# Patient Record
Sex: Female | Born: 1965 | Hispanic: No | Marital: Married | Smoking: Never smoker
Health system: Southern US, Community
[De-identification: ages and names within clinical notes are randomized; demographics above are authoritative.]

## PROBLEM LIST (undated history)

## (undated) DIAGNOSIS — T7840XA Allergy, unspecified, initial encounter: Secondary | ICD-10-CM

## (undated) HISTORY — PX: ENDOVENOUS ABLATION SAPHENOUS VEIN W/ LASER: SUR449

## (undated) HISTORY — PX: WISDOM TOOTH EXTRACTION: SHX21

## (undated) HISTORY — DX: Allergy, unspecified, initial encounter: T78.40XA

## (undated) HISTORY — PX: EYE SURGERY: SHX253

---

## 1997-06-22 ENCOUNTER — Inpatient Hospital Stay (HOSPITAL_COMMUNITY): Admission: AD | Admit: 1997-06-22 | Discharge: 1997-06-22 | Payer: Self-pay | Admitting: Obstetrics and Gynecology

## 1997-06-30 ENCOUNTER — Encounter (HOSPITAL_COMMUNITY): Admission: AD | Admit: 1997-06-30 | Discharge: 1997-07-05 | Payer: Self-pay | Admitting: Obstetrics and Gynecology

## 1997-07-04 ENCOUNTER — Inpatient Hospital Stay (HOSPITAL_COMMUNITY): Admission: AD | Admit: 1997-07-04 | Discharge: 1997-07-06 | Payer: Self-pay | Admitting: *Deleted

## 1997-08-07 ENCOUNTER — Other Ambulatory Visit: Admission: RE | Admit: 1997-08-07 | Discharge: 1997-08-07 | Payer: Self-pay | Admitting: Obstetrics and Gynecology

## 1998-08-14 ENCOUNTER — Other Ambulatory Visit: Admission: RE | Admit: 1998-08-14 | Discharge: 1998-08-14 | Payer: Self-pay | Admitting: Obstetrics and Gynecology

## 1999-09-10 ENCOUNTER — Other Ambulatory Visit: Admission: RE | Admit: 1999-09-10 | Discharge: 1999-09-10 | Payer: Self-pay | Admitting: Obstetrics and Gynecology

## 2000-11-03 ENCOUNTER — Other Ambulatory Visit: Admission: RE | Admit: 2000-11-03 | Discharge: 2000-11-03 | Payer: Self-pay | Admitting: *Deleted

## 2001-08-19 ENCOUNTER — Inpatient Hospital Stay (HOSPITAL_COMMUNITY): Admission: AD | Admit: 2001-08-19 | Discharge: 2001-08-23 | Payer: Self-pay | Admitting: *Deleted

## 2001-10-12 ENCOUNTER — Other Ambulatory Visit: Admission: RE | Admit: 2001-10-12 | Discharge: 2001-10-12 | Payer: Self-pay | Admitting: *Deleted

## 2002-11-01 ENCOUNTER — Other Ambulatory Visit: Admission: RE | Admit: 2002-11-01 | Discharge: 2002-11-01 | Payer: Self-pay | Admitting: *Deleted

## 2003-05-14 ENCOUNTER — Inpatient Hospital Stay (HOSPITAL_COMMUNITY): Admission: AD | Admit: 2003-05-14 | Discharge: 2003-05-14 | Payer: Self-pay | Admitting: Obstetrics and Gynecology

## 2003-08-31 ENCOUNTER — Inpatient Hospital Stay (HOSPITAL_COMMUNITY): Admission: AD | Admit: 2003-08-31 | Discharge: 2003-09-06 | Payer: Self-pay | Admitting: *Deleted

## 2003-09-02 ENCOUNTER — Encounter (INDEPENDENT_AMBULATORY_CARE_PROVIDER_SITE_OTHER): Payer: Self-pay | Admitting: Specialist

## 2003-09-07 ENCOUNTER — Encounter: Admission: RE | Admit: 2003-09-07 | Discharge: 2003-10-07 | Payer: Self-pay | Admitting: Obstetrics and Gynecology

## 2003-10-20 ENCOUNTER — Other Ambulatory Visit: Admission: RE | Admit: 2003-10-20 | Discharge: 2003-10-20 | Payer: Self-pay | Admitting: Obstetrics & Gynecology

## 2004-02-12 ENCOUNTER — Ambulatory Visit: Payer: Self-pay | Admitting: Family Medicine

## 2004-11-05 ENCOUNTER — Other Ambulatory Visit: Admission: RE | Admit: 2004-11-05 | Discharge: 2004-11-05 | Payer: Self-pay | Admitting: Obstetrics & Gynecology

## 2005-01-16 ENCOUNTER — Encounter: Payer: Self-pay | Admitting: Family Medicine

## 2005-02-03 ENCOUNTER — Encounter: Admission: RE | Admit: 2005-02-03 | Discharge: 2005-02-03 | Payer: Self-pay | Admitting: Obstetrics & Gynecology

## 2005-02-13 ENCOUNTER — Ambulatory Visit: Payer: Self-pay | Admitting: Family Medicine

## 2005-09-29 ENCOUNTER — Ambulatory Visit: Payer: Self-pay | Admitting: Family Medicine

## 2006-03-18 ENCOUNTER — Ambulatory Visit: Payer: Self-pay | Admitting: Family Medicine

## 2007-01-27 ENCOUNTER — Encounter: Admission: RE | Admit: 2007-01-27 | Discharge: 2007-01-27 | Payer: Self-pay | Admitting: Obstetrics & Gynecology

## 2008-01-10 ENCOUNTER — Ambulatory Visit: Payer: Self-pay | Admitting: Family Medicine

## 2008-04-10 IMAGING — MG MM DIGITAL SCREENING
4 series · 4 of 4 positions shown · non-contrast
Comparison: none

DG SCREEN MAMMOGRAM BILATERAL
Bilateral CC and MLO view(s) were taken.

DIGITAL SCREENING MAMMOGRAM WITH CAD:
The breast tissue is heterogeneously dense.  No masses or malignant type calcifications are 
identified.  Compared with prior studies.

[R CC]
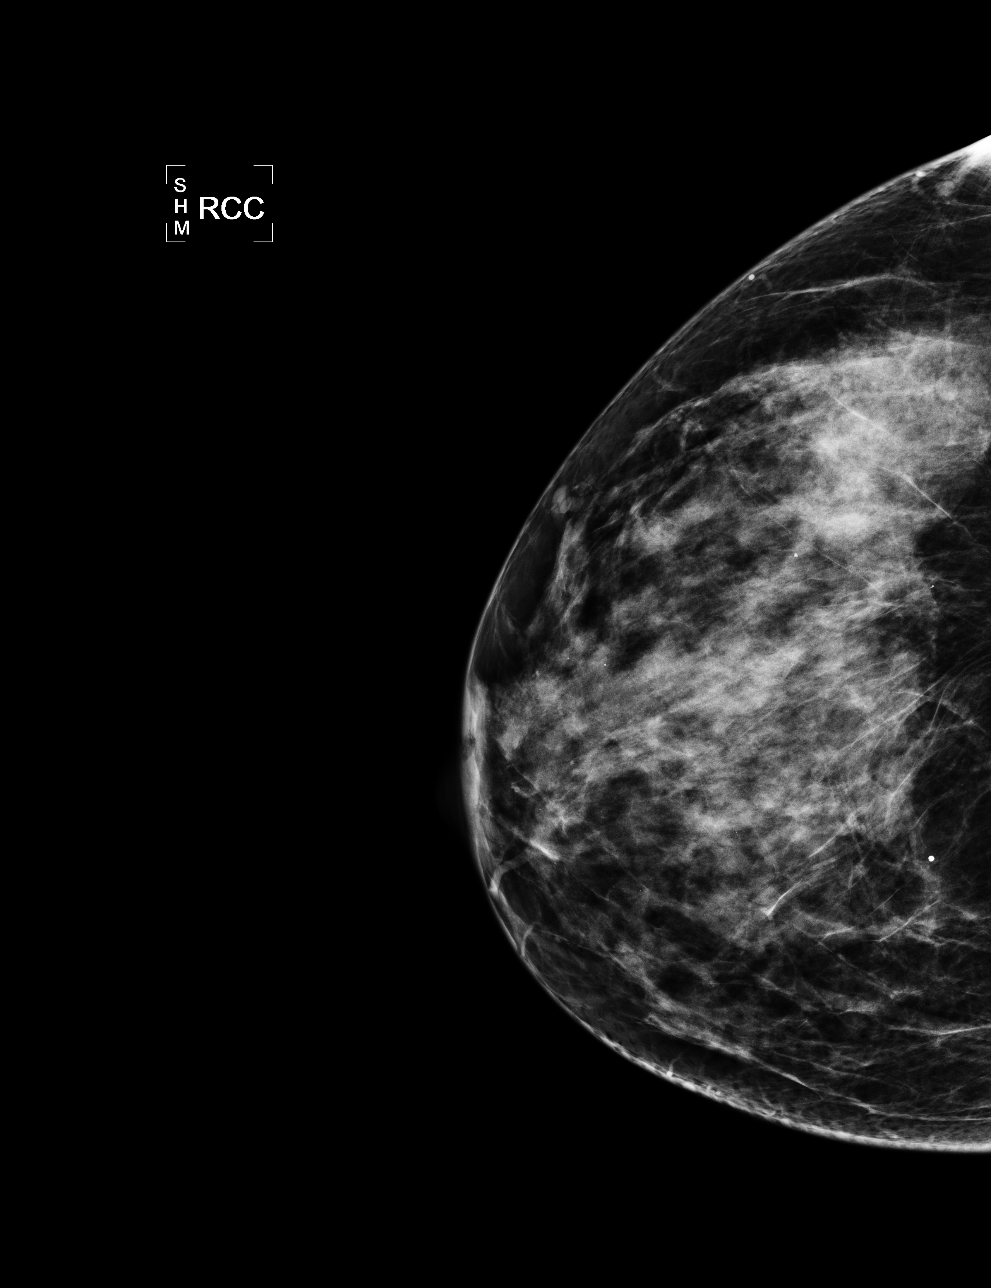

[R MLO]
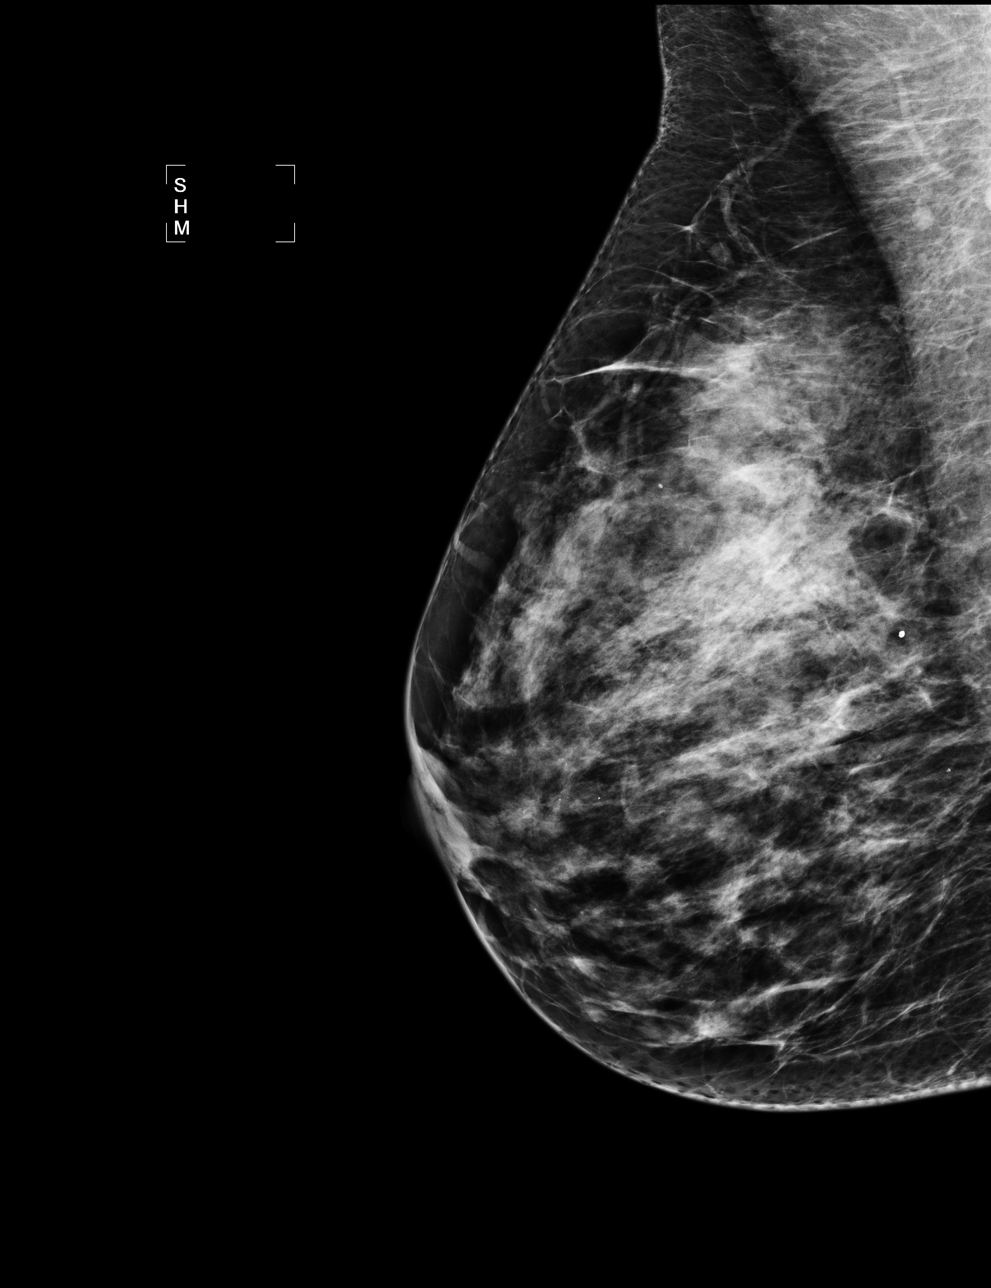

[L CC]
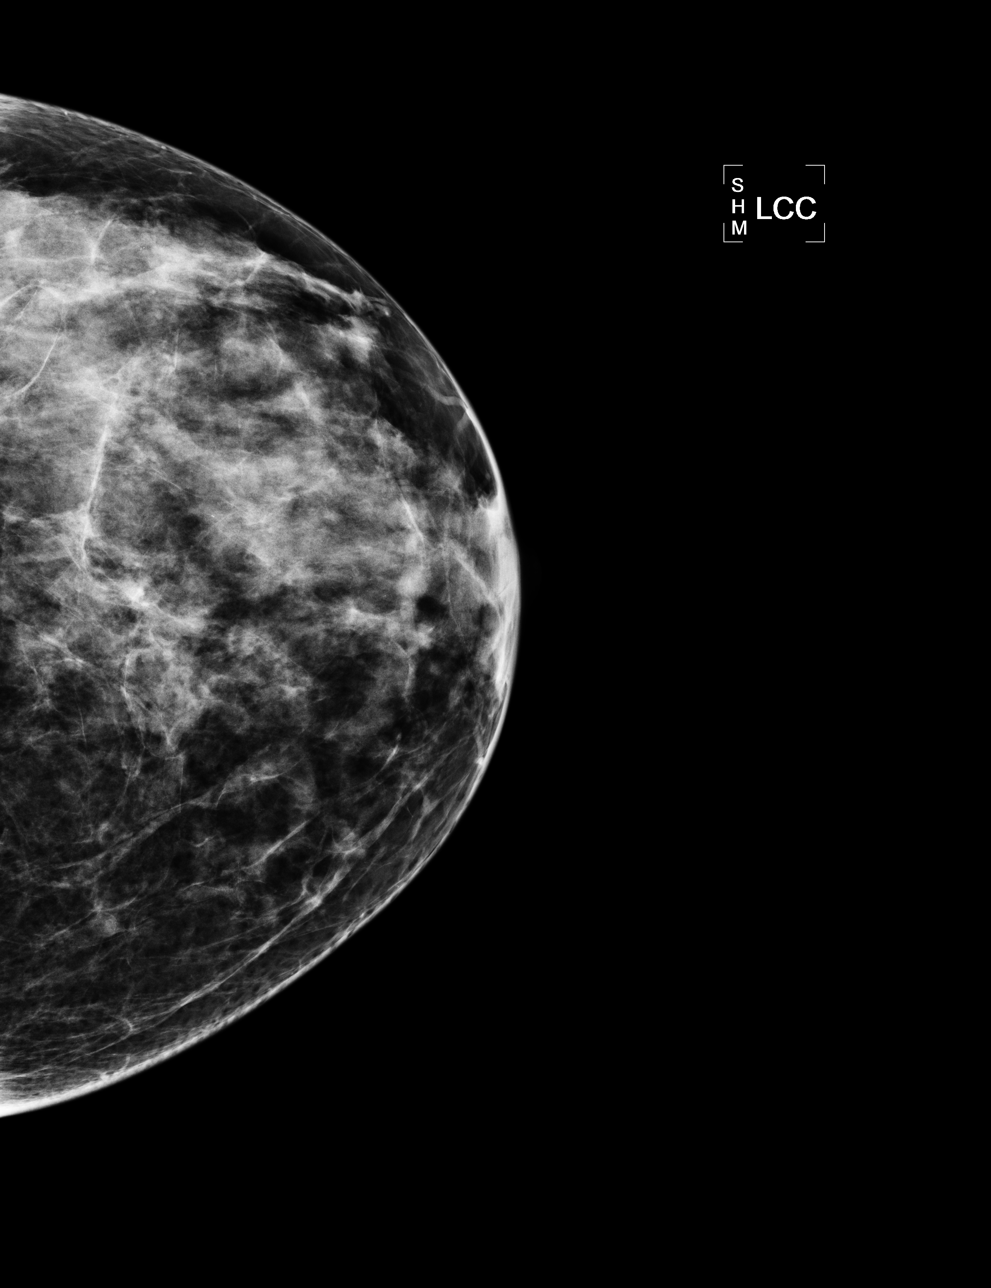

[L MLO]
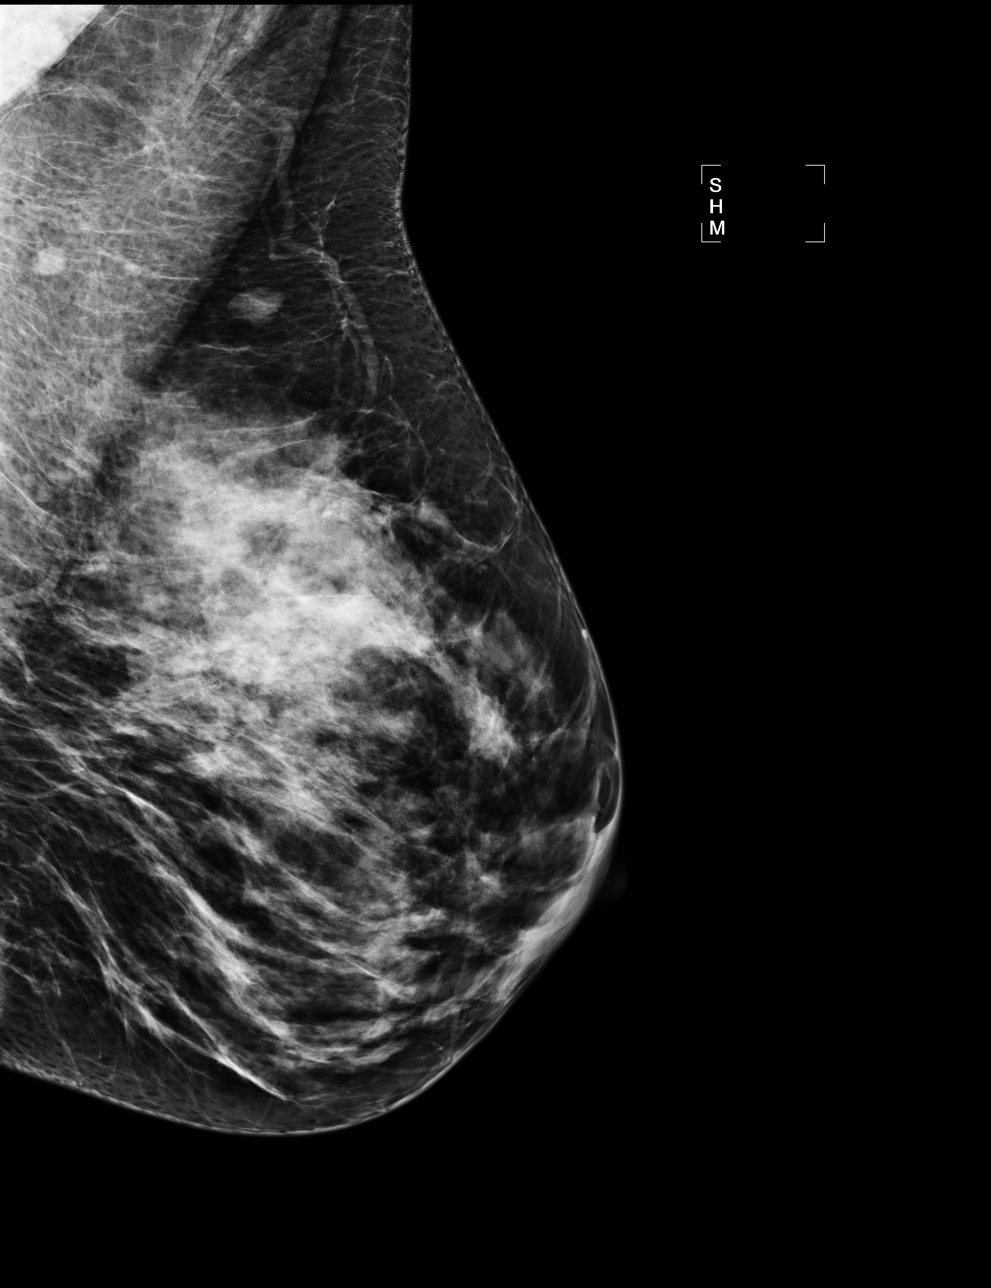

[4 of 4 positions shown; findings below may reference images not displayed]

IMPRESSION: No specific mammographic evidence of malignancy.  Next screening mammogram is recommended in one 
year.

ASSESSMENT: Negative - BI-RADS 1

Screening mammogram in 1 year.
THIS WAS ANALAYZED BY COMPUTER AIDED DETECTION. , THIS PROCEDURE WAS A DIGITAL MAMMOGRAM.

## 2008-09-13 ENCOUNTER — Encounter: Payer: Self-pay | Admitting: Family Medicine

## 2008-12-04 ENCOUNTER — Encounter: Payer: Self-pay | Admitting: Family Medicine

## 2008-12-05 ENCOUNTER — Ambulatory Visit: Payer: Self-pay | Admitting: Family Medicine

## 2008-12-05 DIAGNOSIS — M79609 Pain in unspecified limb: Secondary | ICD-10-CM

## 2008-12-18 ENCOUNTER — Ambulatory Visit: Payer: Self-pay

## 2009-11-27 ENCOUNTER — Encounter: Payer: Self-pay | Admitting: Family Medicine

## 2010-04-30 NOTE — Assessment & Plan Note (Signed)
Summary: L ARM PAIN WHILE HIKING/CLE   Vital Signs:  Patient profile:   45 year old female Height:      64 inches Weight:      117 pounds BMI:     20.16 Temp:     98 degrees F oral Pulse rate:   76 / minute Pulse rhythm:   regular BP sitting:   100 / 68  (left arm)  Vitals Entered By: Liane Comber CMA Duncan Dull) (December 05, 2008 10:25 AM)  History of Present Illness: was out hiking  started getting tingling in fingers in L hand and then pain into L arm  this made her panicy-- and then HR inc and chest got tight (no pain ) lasted 5-10 min at most  sat down and rested-- and then she was fine   no residual sorenss at all   never happened to her in past  this was not particularly hard trail-- has done fine on other harder trails  was not overheated  had eaten lasagna for lunch  no heartburn   is in fairly good shape- plays sports with kid   no alcohol , no caff and never smoked   never had high cholesterol - last checked 2-3 y ago  at gyn office , good     Allergies (verified): 1)  ! Sulfa 2)  ! Amoxicillin  Past History:  Past Medical History:     gyn- Dr Jennette Kettle  Family History: father - chol  GM- CAD/ bypass- at old age  uncles/ aunts with chol  no high bp in family  Social History: non smoker - never smoked  no alcohol  no caffine has 78 y old daughter    Review of Systems General:  Denies fatigue, fever, loss of appetite, and malaise. Eyes:  Denies blurring and eye pain. CV:  Denies difficulty breathing at night, difficulty breathing while lying down, palpitations, shortness of breath with exertion, and swelling of feet; no PND or orthopnea . GI:  Denies indigestion, nausea, and vomiting. MS:  Denies joint pain, joint redness, and joint swelling. Derm:  Denies itching, lesion(s), poor wound healing, and rash. Neuro:  Complains of tingling; denies numbness and weakness. Psych:  mood is ok. Endo:  Denies cold intolerance, excessive thirst,  excessive urination, and heat intolerance. Heme:  Denies abnormal bruising and bleeding.  Physical Exam  General:  Well-developed,well-nourished,in no acute distress; alert,appropriate and cooperative throughout examination Head:  normocephalic, atraumatic, and no abnormalities observed.   Eyes:  vision grossly intact, pupils equal, pupils round, and pupils reactive to light.   Mouth:  pharynx pink and moist.   Neck:  supple with full rom and no masses or thyromegally, no JVD or carotid bruit  Chest Wall:  No deformities, masses, or tenderness noted. Lungs:  Normal respiratory effort, chest expands symmetrically. Lungs are clear to auscultation, no crackles or wheezes. Heart:  Normal rate and regular rhythm. S1 and S2 normal without gallop, murmur, click, rub or other extra sounds. Abdomen:  Bowel sounds positive,abdomen soft and non-tender without masses, organomegaly or hernias noted. Msk:  no kyphosis  no acute joint changes  Extremities:  No clubbing, cyanosis, edema, or deformity noted with normal full range of motion of all joints.   Neurologic:  sensation intact to light touch, gait normal, and DTRs symmetrical and normal.   Skin:  Intact without suspicious lesions or rashes Cervical Nodes:  No lymphadenopathy noted Inguinal Nodes:  No significant adenopathy Psych:  normal affect, talkative  and pleasant    Impression & Recommendations:  Problem # 1:  ARM PAIN, LEFT (ICD-729.5) Assessment New exertional- solitary episode with no assoc symptoms  some remote cardiac hx in family in old age no risk factors  will obt last chol from her GYN send for exercise only treadmill stress test and update Orders: EKG w/ Interpretation (93000) Cardiology Referral (Cardiology) EKG w/ Interpretation (93000)  Complete Medication List: 1)  Ortho Tri-cyclen Lo 0.18/0.215/0.25 Mg-25 Mcg Tabs (Norgestim-eth estrad triphasic) .... Use as directed 2)  Multivitamins Tabs (Multiple vitamin) ....  By mouth daily  Patient Instructions: 1)  send to Dr Jennette Kettle (gyn ) for last cholesterol labs  2)  we will refer you for treadmill stress test at check out  3)  eat a healthy diet  4)  update me / seek care if symptoms return   Prior Medications (reviewed today): ORTHO TRI-CYCLEN LO 0.18/0.215/0.25 MG-25 MCG TABS (NORGESTIM-ETH ESTRAD TRIPHASIC) use as directed MULTIVITAMINS   TABS (MULTIPLE VITAMIN) by mouth daily Current Allergies (reviewed today): ! SULFA ! AMOXICILLIN Current Medications (including changes made in today's visit):  ORTHO TRI-CYCLEN LO 0.18/0.215/0.25 MG-25 MCG TABS (NORGESTIM-ETH ESTRAD TRIPHASIC) use as directed MULTIVITAMINS   TABS (MULTIPLE VITAMIN) by mouth daily     EKG  Procedure date:  12/05/2008  Findings:      NSR with rate of 61 no acute changes computer reads as poor R wave progression

## 2010-04-30 NOTE — Assessment & Plan Note (Signed)
Summary: treadmill with PA/jml   Exercise Tolerance Test Cardiovascular Risk History:      Negative major cardiovascular risk factors include female age < 47 years old, no history of diabetes or hyperlipidemia, no history of hypertension, and non-tobacco-Sady Mon[place] status.        Further assessment for target organ damage reveals no history of ASHD, cardiac end-organ damage (CHF/LVH), stroke/TIA, peripheral vascular disease, renal insufficiency, or hypertensive retinopathy.    Baseline EKG:    Rhythm:     normal sinus    Rate:       95    PR:       .13    QRS:       .07    QT:       .34    QTc:       .42  Exercise Tolerance Test Results:    Ordering MD:        Roxy Manns    Interpreting MD:     Jacolyn Reedy [facility]    Stress Modality:     exercise-treadmill    Cardiac Imaging Performed:   none    Protocol:       Standard Bruce-maximal    Maximum BP:        140 / 68    MPHR (bpm):        176    85% MPHR (bpm):     150    MHR obtained (bpm):        181    Reached 85% MPHR       (min:sec):       6:00    Total Exercise Time       (min:sec):       10:16    Workload in METS:     12.0    Borg Scale:       14    ST Segment analysis:       At Rest:       normal ST segments-no evidence of significant ST depression       With Exercise:     no evidence of significant ST depression    Arrhythmia:             no    Angina during ETT:     absent (0)  Cardiovascular Risk Assessment/Plan:      The patient's hypertensive risk group is category A: No risk factors and no target organ damage.  Today's blood pressure is 88/73.  Her blood pressure goal is < 140/90.  Exercise Tolerance Test Assessment:    Quality of ETT:   diagnostic    ETT Interpretation:   normal-no evidence of ischemia by ST analysis    Comments:     Early elevation of heart rate consistant with deconditioning. No ST-T changes.     Recommendations:   f/u Dr. Milinda Antis  Appended Document: treadmill with PA/jml please adv pt that stress  test is normal- that is reassuring  Left message on voicemail  to return call. Lugene Fuquay CMA (AAMA)  December 21, 2008 9:40 AM  Advised patient.  ......................................................Marland KitchenNatasha Chavers CMA (AAMA) December 21, 2008 12:01 PM  Clinical Lists Changes  Observations: Added new observation of PAST SURG HX: 9/10 exercise stress test neg (12/20/2008 20:18)        Past History:  Past Surgical History: 9/10 exercise stress test neg

## 2010-04-30 NOTE — Letter (Signed)
Summary: [facility]  [facility]   Imported By: Lanelle Bal 09/28/2008 12:48:02  _____________________________________________________________________  External Attachment:    Type:   Image     Comment:   External Document

## 2010-04-30 NOTE — Assessment & Plan Note (Signed)
Summary: FLU SHOT/CLE  Nurse Visit        Orders Added: 1)  Admin 1st Vaccine [90471] 2)  Flu Vaccine 61yrs + [44034]   Flu Vaccine Consent Questions     Do you have a history of severe allergic reactions to this vaccine? no    Any prior history of allergic reactions to egg and/or gelatin? no    Do you have a sensitivity to the preservative Thimersol? no    Do you have a past history of Guillan-Barre Syndrome? no    Do you currently have an acute febrile illness? no    Have you ever had a severe reaction to latex? no    Vaccine information given and explained to patient? yes    Are you currently pregnant? no    Lot Number:[id number]   Site Given  Left Deltoid IM].opcflu

## 2010-04-30 NOTE — Letter (Signed)
Summary: [facility]  [facility]   Imported By: Sherian Rein 12/19/2009 11:06:21  _____________________________________________________________________  External Attachment:    Type:   Image     Comment:   External Document

## 2010-08-16 NOTE — Op Note (Signed)
NAME:  Teresa Wilkerson, Teresa Wilkerson                          ACCOUNT NO.:  [id number]   MEDICAL RECORD NO.:  [id number]                   PATIENT TYPE:  INP   LOCATION:  9172                                 FACILITY:  [facility]   PHYSICIAN:  Duke Salvia. Marcelle Overlie, M.D.            DATE OF BIRTH:  26-Feb-1966   DATE OF PROCEDURE:  09/02/2003  DATE OF DISCHARGE:                                 OPERATIVE REPORT   PREOPERATIVE DIAGNOSES:  1. A 34-4/7 week intrauterine pregnancy.  2. Previous cesarean section, desires repeat.  3. PPRON.  4. Mature L:S ratio (2.2:1 no PT).   POSTOPERATIVE DIAGNOSES:  1. A 34-4/7 week intrauterine pregnancy.  2. Previous cesarean section, desires repeat.  3. PPRON.  4. Mature L:S ratio (2.2:1 no PT).   PROCEDURE:  Repeat low transverse cesarean section.   SURGEON:  Duke Salvia. Marcelle Overlie, M.D.   ANESTHESIA:  Spinal.   COMPLICATIONS:  None.   DRAINS:  Foley catheter.   ESTIMATED BLOOD LOSS:  800 cc.   PROCEDURE AND FINDINGS:  The patient was taken to the operating room.  After  an adequate level of spinal anesthetic was obtained, with the patient's legs  supine, the abdomen prepped and draped in the usual manner for sterile  abdominal procedures.  A Foley catheter was positioned, draining clear  urine.  A transverse incision was made, excising the old scar.  This was  carried down to the fascia, which was incised and extended transversely.  Rectus muscles were divided in the midline.  The peritoneum was entered  superiorly without incident, and extended in a vertical fashion.  There were  no unusual scar tissue in that area. The bladder flap and peritoneum was  then incised.  The bladder was sharply and bluntly dissected below, and the  bladder blade was positioned.  A small incision was made transversely in the  lower segment, which was quite thin. This was extended with blunt dissection  and clear fluid noted.  A vertex presenting, Apgar 8 and 9, pH pending.  The  infant suctioned, cord clamped and infant passed to pediatric team for  further care.  The placenta delivered manually intact; sent to pathology.  The uterine cavity was wiped clean with the laparotomy pack.  Closure  obtained with first layer of 0 chromic interlocked fashion, followed by an  embrocating layer of 0 chromic.  This was hemostatic.   The tubes and ovaries were normal.  Prior to closure sponge, needle and  instrument counts were reported as correct x2.  The peritoneum was closed  with a running 2-0 Dexon suture.  The fascia was closed from _________ to  midline on either side with a  O PDS suture.  Subcutaneous was hemostatic.  Clips and Steri-Strips used on  the skin.   She tolerated this well and went to the recovery room in good condition.  Clear urine noted at the end of the  case.  Pefloxacin will be continued for  several doses postoperatively.                                               Richard M. Marcelle Overlie, M.D.    RMH/MEDQ  D:  09/02/2003  T:  09/02/2003  Job:  [id number]

## 2010-08-16 NOTE — Discharge Summary (Signed)
[facility]  Patient:    ARNOLA, CRITTENDON Visit Number: [id number] MRN: [id number]          Service Type: OBS Location: 910A 9114 01 Attending Physician:  Trevor Iha Dictated by:   Julio Sicks, N.P. Admit Date:  08/19/2001 Discharge Date: 08/23/2001                             Discharge Summary  ADMITTING DIAGNOSES:          1. Intrauterine pregnancy at term.                               2. Induction of labor.  DISCHARGE DIAGNOSES:          1. Low transverse cesarean delivery.                               2. Breech presentation.                               3. Viable female infant.  PROCEDURE:                    Primary low transverse cesarean delivery.  REASON FOR ADMISSION:         Please see dictated H&P.  HOSPITAL COURSE:              The patient was admitted for an induction of labor at 40 weeks estimated gestational age.  The patient underwent Cytotec cervical ripening and progressed to 4 cm dilated.  At that time, the baby was found to be in a breech presentation.  Because of labor and breech, a decision was made to proceed with a primary low transverse cesarean delivery.  The patient was taken to the operating room where a spinal anesthesia was administered without difficulty.  A low transverse incision was made with the delivery of a viable female infant.  Apgars were 9 at one minute and 10 at five minutes.  Arterial pH was 7.30.  The patient tolerated the procedure well and was taken to the recovery room.  On postoperative day one, the patient was without complaint.  Abdominal dressing was clean, dry, and intact.  The patient was tolerating a clear liquid diet without complaint.  Labs revealed hemoglobin of 9.6, hematocrit 28.5, WBC 7.8.  On postoperative day two, the incision was clean, dry, and intact; abdomen soft.  The patient was ambulating well without assistance.  On postoperative day three, incision was clean,  dry, and intact.  The staples were removed, and the patient was discharged home.  CONDITION ON DISCHARGE:       Good.  DIET:                         Regular as tolerated.  ACTIVITY:                     No heavy lifting.  No driving x 2 weeks.  No vaginal entry.  FOLLOW-UP:                    The patient is to follow up in the office in 1-2 weeks for an incision check.  She is to  call for a temperature greater than 100 degrees, persistent nausea or vomiting, heavy vaginal bleeding and/or redness or drainage from the incision site.  DISCHARGE MEDICATIONS:        1. Prenatal vitamins 1 p.o. q.d.                               2. Percocet 5/325 #21 q.4-6h. p.r.n. pain. Dictated by:   Julio Sicks, N.P. Attending Physician:  Trevor Iha DD:  09/03/01 TD:  09/06/01 Job: [id number]

## 2010-08-16 NOTE — Discharge Summary (Signed)
NAME:  Wilkerson, Teresa K                          ACCOUNT NO.:  [id number]   MEDICAL RECORD NO.:  [id number]                   PATIENT TYPE:  INP   LOCATION:  9318                                 FACILITY:  [facility]   PHYSICIAN:  Michelle L. Vincente Poli, M.D.            DATE OF BIRTH:  07-01-1965   DATE OF ADMISSION:  08/31/2003  DATE OF DISCHARGE:  09/06/2003                                 DISCHARGE SUMMARY   ADMITTING DIAGNOSES:  1. Intrauterine pregnancy at 41 and two-sevenths weeks estimated gestational     age.  2. Preterm premature rupture of membranes.  3. Previous cesarean section, desires repeat.   DISCHARGE DIAGNOSES:  1. Status post low transverse cesarean section.  2. Viable female infant.   PROCEDURE:  Repeat low transverse cesarean section.   REASON FOR ADMISSION:  Please see written H&P.   HOSPITAL COURSE:  The patient was a 45 year old [place] married female gravida  3 para 2 that was admitted to [facility] at 34 and two-  sevenths weeks with preterm premature rupture of membranes.  The patient had  had a previous cesarean section for a breech delivery with her second child.  On the morning of admission the patient was noted to have spontaneous  rupture of membranes with clear fluid followed by uterine contractions.  Cervix was 1 cm, thick, and a -2 station.  Ultrasound was performed which  revealed a fetus in vertex presentation.  Uterine contractions were  irregular and mild.  CBC was drawn with hemoglobin of 11.5; platelet count  162,000; wbc count of 7.4.  The patient was started on IV antibiotics.  Fetal heart tones remained reactive.  Uterine contractions continued to be  irregular and mild.  Ultrasound was performed which revealed a fetus with  estimated fetal weight of 2200-2300 g which was the 50th percentile.  AFI  was 6.5.  The following morning the patient was noted to have a rare  contractions.  Fetal heart tones remained reactive in the 140s.   She was  afebrile.  Decision was made for amniocentesis for determination of lung  maturity which resulted in an L/S ratio of 2.2:1 without PG.  Discussion was  made with the patient regarding risks of chorioamnionitis and decision was  made to proceed with a low transverse cesarean section.  On the following  morning the patient was taken to the operating room where spinal anesthesia  was administered without difficulty.  A low transverse incision was made  with the delivery of a viable female infant weighing 5 pounds 0.3 ounces  with Apgars of 8 at one minute and 9 at five minutes . Umbilical cord pH was  7.37.  The patient tolerated the procedure well and was taken to the  recovery room in stable condition.  On postoperative day #1 the patient was  without complaint.  Vital signs were stable; she was afebrile.  Abdomen was  soft with good return of bowel function.  Abdominal dressing was noted to be  clean, dry, and intact.  Labs revealed hemoglobin of 11.2.  On postoperative  day #2 the patient was without complaint.  The baby was in the NICU.  Vital  signs were stable; she was afebrile.  Abdomen was soft.  She was ambulating  well and tolerating a regular diet without complaints of nausea and  vomiting.  Labs revealed hemoglobin of 11.2.  On postoperative day #3 the  patient was without complaint.  Vital signs were stable.  Fundus was firm  and nontender.  Incision was clean, dry, and intact.  On postoperative day  #4 the patient was without complaint.  Vital signs were stable.  Abdomen was  soft, fundus was firm.  Incision was noted to have some ecchymosis noted  superior and inferior to the incisional site.  Staples remained in.  Discharge instructions were reviewed and the patient was discharged home.   CONDITION ON DISCHARGE:  Good.   DIET:  Regular as tolerated.   ACTIVITY:  No heavy lifting, no driving x2 weeks, no vaginal entry.   FOLLOW-UP:  The patient is to follow up in  the office in 2-3 days for staple  removal.  She is to call for temperature greater than 100 degrees,  persistent nausea and vomiting, heavy vaginal bleeding, and/or redness or  drainage from the incisional site.   DISCHARGE MEDICATIONS:  1. Tylox #30 one p.o. q.4-6h. p.r.n.  2. Motrin 600 mg q.6h. p.r.n.  3. Prenatal vitamins one p.o. daily.  4. Colace one p.o. daily p.r.n.     Julio Sicks, N.P.                        Stann Mainland. Vincente Poli, M.D.    CC/MEDQ  D:  09/29/2003  T:  09/29/2003  Job:  [id number]

## 2010-08-16 NOTE — H&P (Signed)
[facility]  Patient:    Teresa Wilkerson, Teresa Wilkerson Visit Number: [id number] MRN: [id number]          Service Type: OBS Location: 910A 9114 01 Attending Physician:  Trevor Iha Dictated by:   Trevor Iha, M.D. Admit Date:  08/19/2001   CC:         Marcelle Overlie, M.D.   History and Physical  HISTORY OF PRESENT ILLNESS:  The patient is a 45 year old g2 p1 at 39-5/7 weeks who presents for a 2 stage induction due to social reasons.  Pregnancy has been complicated by advanced maternal age with a normal amniocentesis, group B streptococcus was negative.  Her cervical examination on Aug 19, 2001, was 1, thick, and high.  PAST MEDICAL HISTORY:  Negative.  PAST SURGICAL HISTORY:  Wisdom teeth extraction and she had spontaneous vaginal delivery in 1999.  PHYSICAL EXAMINATION:  VITAL SIGNS:  Blood pressure 108/64.  HEART:  Regular rate and rhythm.  LUNGS:  Clear to auscultation bilaterally.  ABDOMEN:  Gravid and nontender.  Fundal height is 37.  Cervix is 1, thick, and high per Dr. Vincente Poli.  IMPRESSION AND PLAN:  Intrauterine pregnancy at 39-5/7 weeks.  Desires induction of labor.  Plan 2 stage induction.  Plan Cytotec tonight and Pitocin in the morning. Dictated by:   Trevor Iha, M.D. Attending Physician:  Trevor Iha DD:  08/19/01 TD:  08/19/01 Job: [id number]

## 2010-08-16 NOTE — Op Note (Signed)
[facility]  Patient:    Teresa Wilkerson, Teresa Wilkerson Visit Number: [id number] MRN: [id number]          Service Type: OBS Location: [id number] Attending Physician:  Trevor Iha Dictated by:   Trevor Iha, M.D. Proc. Date: 08/20/01 Admit Date:  08/19/2001 Discharge Date: 08/23/2001                             Operative Report  PREOPERATIVE DIAGNOSIS:       Intrauterine pregnancy at 40 weeks in labor and                               breech presentation.  POSTOPERATIVE DIAGNOSIS:      Intrauterine pregnancy at 40 weeks in labor and                               breech presentation.  OPERATION:                    Primary low segment transverse cesarean section.  SURGEON:                      Trevor Iha, M.D.  ANESTHESIA:                   Spinal.  ESTIMATED BLOOD LOSS:         800 cc.  INDICATIONS:                  The patient is a 45 year old G2, P1, at 40 weeks who was admitted for induction of labor.  She underwent Cytotec cervical ripening and she progressed to 4 cm dilated.  At that time, she was found to be breech presentation.  Because of labor and breech, proceeded with a primary low segment transverse cesarean section.  Risks and benefits were discussed and informed consent was obtained.  FINDINGS:                     At the time of surgery, a viable female infant, Apgars were 9 and 10.  PH arterial 7.30.  DESCRIPTION OF PROCEDURE:     After adequate analgesia, the patient was placed in the supine position with a left lateral tilt.  She was sterilely prepped and draped.  A Foley catheter was sterilely placed and a Pfannenstiel skin incision was made two fingerbreadths above the pubic symphysis.  It was taken down sharply and the fascia was incised transversely, and extended superiorly and inferiorly out to the belly of the superior rectus muscle which was separated sharply in the midline.  The peritoneum was entered  sharply.  A bladder blade was placed.  The uterine serosa was elevated and then excised transversely.  A bladder flap was created and placed behind the bladder blade.  A low segment myotomy incision was made down to the infants buttocks, and extended laterally with the operators fingertips, and the buttocks were then delivered atraumatically.  Arms were easily reduced and the head delivered and good cry was noted.  The cord was clamped and cut, and the infant was handed to the pediatricians.  Cord blood was obtained  The placenta was extracted manually.  The uterus was exteriorized and wiped clean with a dry laparotomy and myotomy incision  closed in two layers, the first being a running locking and a second imbricating layer of 0 Monocryl.  The uterus was placed back into the peritoneal cavity and after and after a copious amount of irrigation, adequate hemostasis was ensured.  The peritoneum was closed with 0 Monocryl and the rectus muscle plicated in the midline.  Irrigation was applied and after adequate hemostasis, the fascia was closed with #1 Vicryl in a running fashion.  Irrigation was applied after adequate hemostasis.  The skin with staples and Steri-Strips applied.  The patient tolerated the procedure well and was stable on transfer to the recovery room.  Special instrument count was normal x3.  The patient received 900 mg of clindamycin after delivery of the placenta.  Estimated blood loss was 800 cc. Dictated by:   Trevor Iha, M.D. Attending Physician:  Trevor Iha DD:  08/20/01 TD:  08/23/01 Job: [id number]

## 2011-02-28 ENCOUNTER — Ambulatory Visit (INDEPENDENT_AMBULATORY_CARE_PROVIDER_SITE_OTHER): Payer: 59 | Admitting: Family Medicine

## 2011-02-28 ENCOUNTER — Encounter: Payer: Self-pay | Admitting: Family Medicine

## 2011-02-28 DIAGNOSIS — J029 Acute pharyngitis, unspecified: Secondary | ICD-10-CM

## 2011-02-28 DIAGNOSIS — R21 Rash and other nonspecific skin eruption: Secondary | ICD-10-CM

## 2011-02-28 LAB — POCT RAPID STREP A (OFFICE): Rapid Strep A Screen: NEGATIVE

## 2011-02-28 NOTE — Patient Instructions (Signed)
I'm not sure where this rash is coming from. It could be just a viral rash after viral pharyngitis. Strep test was negative. If just viral, should improve with time. Let us know if fever >101, spreading rash or other concerns.

## 2011-02-28 NOTE — Assessment & Plan Note (Signed)
Mild erythematous rash associated with recent pharyngitis.  Checked RST - negative. Anticipate possible viral exanthem. Supportive care. Not consistent with candida.

## 2011-02-28 NOTE — Progress Notes (Addendum)
  Subjective:    Patient ID: Teresa Wilkerson, female    DOB: 02-Jul-1965, 45 y.o.   MRN: [id number]  HPI CC: rash  1d h/o rash under left arm.  Today started with rash under right arm and legs.  Bright red.  Not itchy, not tender.  Spares face, palms and soles, no oral lesions.  No fevers/chills, abd pain, n/v.  ST on Monday, hasn't improved.  Feeling more fatigued.  No fevers/chills.  Stays congested, coughing from allergies.  No new foods, medicines, lotions, detergents, shampoos, soaps.  Daughter sick this weekend with fever, ST, congestion but no rash.  No smokers at home.  H/o allergic rhinitis, takes allergy shots for this  Review of Systems Per HPI    Objective:   Physical Exam  Nursing note and vitals reviewed. Constitutional: She appears well-developed and well-nourished.  HENT:  Head: Normocephalic and atraumatic.  Right Ear: External ear normal.  Left Ear: External ear normal.  Mouth/Throat: Posterior oropharyngeal erythema present. No oropharyngeal exudate, posterior oropharyngeal edema or tonsillar abscesses.  Eyes: Conjunctivae and EOM are normal. Pupils are equal, round, and reactive to light.  Neck: Normal range of motion. Neck supple.  Cardiovascular: Normal rate, regular rhythm, normal heart sounds and intact distal pulses.   No murmur heard. Pulmonary/Chest: Effort normal and breath sounds normal. No respiratory distress. She has no wheezes. She has no rales.  Abdominal: Soft. Bowel sounds are normal. She exhibits no distension. There is no tenderness. There is no rebound and no guarding.  Musculoskeletal: She exhibits no edema.  Skin: Skin is warm and dry. Rash noted.          erythematous rash posterior axillary region as well as groin region, spares face, palms, soles, trunk and back - not papular or vesicular No blistering, peeling, scale  Psychiatric: She has a normal mood and affect.      Assessment & Plan:

## 2011-05-21 ENCOUNTER — Ambulatory Visit: Payer: 59 | Admitting: Family Medicine

## 2013-03-17 ENCOUNTER — Ambulatory Visit: Payer: 59 | Admitting: Family Medicine

## 2014-07-11 ENCOUNTER — Other Ambulatory Visit: Payer: Self-pay | Admitting: Obstetrics and Gynecology

## 2014-07-12 LAB — CYTOLOGY - PAP

## 2015-02-15 ENCOUNTER — Ambulatory Visit (INDEPENDENT_AMBULATORY_CARE_PROVIDER_SITE_OTHER): Payer: 59

## 2015-02-15 DIAGNOSIS — Z23 Encounter for immunization: Secondary | ICD-10-CM | POA: Diagnosis not present

## 2015-03-19 LAB — HM PAP SMEAR: HM Pap smear: NORMAL

## 2015-03-21 LAB — HM MAMMOGRAPHY: HM MAMMO: NORMAL

## 2015-05-07 ENCOUNTER — Encounter: Payer: Self-pay | Admitting: Internal Medicine

## 2015-05-07 ENCOUNTER — Ambulatory Visit (INDEPENDENT_AMBULATORY_CARE_PROVIDER_SITE_OTHER): Payer: 59 | Admitting: Internal Medicine

## 2015-05-07 VITALS — BP 110/70 | HR 76 | Temp 98.2°F | Ht 64.0 in | Wt 132.0 lb

## 2015-05-07 DIAGNOSIS — M722 Plantar fascial fibromatosis: Secondary | ICD-10-CM

## 2015-05-07 DIAGNOSIS — J302 Other seasonal allergic rhinitis: Secondary | ICD-10-CM | POA: Insufficient documentation

## 2015-05-07 MED ORDER — MELOXICAM 15 MG PO TABS: 15.0000 mg | ORAL_TABLET | Freq: Every day | ORAL | Status: DC

## 2015-05-07 NOTE — Progress Notes (Signed)
HPI  Pt presents to the clinic today to establish care and for management of the conditions listed below. She has not had a PCP in many years but has been following with GYN.  She does c/o righ foot pain. This started about a month ago. The pain is in her heel and radiates in to her arch. She describes the pain as dull and achy but it can be sharp and stabbing with walking. It seems worse first thing in the morning or after sitting for long periods of time. She denies any injury to the area but did start using a treadmill a few weeks prior to the onset of the foot pain. She has not tried anything OTC.  Seasonal allergies: These occur all year long. She takes Flonase as needed. She does take allergy shots every 1-2 weeks at her allergist in [place].  Flu: 01/2015 Tetanus: > 10 years ago Pap Smear: 03/2015 Mammogram: 03/2015 Colon Screening: Schedule 05/14/15 with Dr. Kinnie Scales Vision Screening: yearly Dentist: biannually  No past medical history on file.  Current Outpatient Prescriptions  Medication Sig Dispense Refill  . fluticasone (FLONASE) 50 MCG/ACT nasal spray Place 2 sprays into both nostrils daily.     No current facility-administered medications for this visit.    Allergies  Allergen Reactions  . Amoxicillin   . Sulfonamide Derivatives     Family History  Problem Relation Age of Onset  . Hyperlipidemia Father   . Coronary artery disease      GM with CABG  . Hyperlipidemia      Uncles and aunts  . Colon cancer Paternal Grandfather     Social History   Social History  . Marital Status: Married    Spouse Name: N/A  . Number of Children: 1  . Years of Education: N/A   Occupational History  . Not on file.   Social History Main Topics  . Smoking status: Never Smoker   . Smokeless tobacco: Never Used  . Alcohol Use: No  . Drug Use: No  . Sexual Activity: Not on file   Other Topics Concern  . Not on file   Social History Narrative   No caffeine     ROS:  Constitutional: Denies fever, malaise, fatigue, headache or abrupt weight changes.  HEENT: Denies eye pain, eye redness, ear pain, ringing in the ears, wax buildup, runny nose, nasal congestion, bloody nose, or sore throat. Respiratory: Denies difficulty breathing, shortness of breath, cough or sputum production.   Cardiovascular: Denies chest pain, chest tightness, palpitations or swelling in the hands or feet.  Musculoskeletal: Pt reports right foot pain. Denies decrease in range of motion, difficulty with gait, muscle pain or joint swelling.  Skin: Denies redness, rashes, lesions or ulcercations.  Neurological: Denies dizziness, difficulty with memory, difficulty with speech or problems with balance and coordination.  Psych: Denies anxiety, depression, SI/HI.  No other specific complaints in a complete review of systems (except as listed in HPI above).  PE:  BP 110/70 mmHg  Pulse 76  Temp(Src) 98.2 F (36.8 C) (Oral)  Ht  (1.626 m)  Wt 132 lb (59.875 kg)  BMI 22.65 kg/m2  SpO2 98%  LMP 04/22/2015 Wt Readings from Last 3 Encounters:  05/07/15 132 lb (59.875 kg)  02/28/11 122 lb (55.339 kg)  12/05/08 117 lb (53.071 kg)    General: Appears her stated age, well developed, well nourished in NAD. HEENT: Head: normal shape and size; Eyes: sclera Schoff, no icterus, conjunctiva pink, PERRLA and  EOMs intact; Ears: Tm's gray and intact, normal light reflex;Throat/Mouth: Teeth present, mucosa pink and moist, no lesions or ulcerations noted.  Cardiovascular: Normal rate and rhythm. S1,S2 noted.  No murmur, rubs or gallops noted.  Pulmonary/Chest: Normal effort and positive vesicular breath sounds. No respiratory distress. No wheezes, rales or ronchi noted.  Musculoskeletal: Normal flexion, extension and rotation of the left ankle. Pain with palpation at the base of the right heel. Strength 5/5 BUE/BLE. No difficulty with gait.  Neurological: Alert and oriented. Sensation  intact to RLE. Psychiatric: Mood and affect normal. Behavior is normal. Judgment and thought content normal.     Assessment and Plan:  Plantar Fasciitis, right foot:  Advised her to get heel/arch supports for her shoes Discussed stretching exercises to help relieve plantar fascia pain eRx for Meloxicam 15 mg daily- advised no other NSAIDS such as Aleve or Ibuprofen while on Meloxicam If persist, consider referral to podiatry vs ortho  RTC in 1 year for annual exam

## 2015-05-07 NOTE — Assessment & Plan Note (Signed)
Continue allergy shots and Flonase per recommendations by your allergist

## 2015-05-07 NOTE — Patient Instructions (Signed)
Plantar Fasciitis Plantar fasciitis is a painful foot condition that affects the heel. It occurs when the band of tissue that connects the toes to the heel bone (plantar fascia) becomes irritated. This can happen after exercising too much or doing other repetitive activities (overuse injury). The pain from plantar fasciitis can range from mild irritation to severe pain that makes it difficult for you to walk or move. The pain is usually worse in the morning or after you have been sitting or lying down for a while. CAUSES This condition may be caused by:  Standing for long periods of time.  Wearing shoes that do not fit.  Doing high-impact activities, including running, aerobics, and ballet.  Being overweight.  Having an abnormal way of walking (gait).  Having tight calf muscles.  Having high arches in your feet.  Starting a new athletic activity. SYMPTOMS The main symptom of this condition is heel pain. Other symptoms include:  Pain that gets worse after activity or exercise.  Pain that is worse in the morning or after resting.  Pain that goes away after you walk for a few minutes. DIAGNOSIS This condition may be diagnosed based on your signs and symptoms. Your health care provider will also do a physical exam to check for:  A tender area on the bottom of your foot.  A high arch in your foot.  Pain when you move your foot.  Difficulty moving your foot. You may also need to have imaging studies to confirm the diagnosis. These can include:  X-rays.  Ultrasound.  MRI. TREATMENT  Treatment for plantar fasciitis depends on the severity of the condition. Your treatment may include:  Rest, ice, and over-the-counter pain medicines to manage your pain.  Exercises to stretch your calves and your plantar fascia.  A splint that holds your foot in a stretched, upward position while you sleep (night splint).  Physical therapy to relieve symptoms and prevent problems in the  future.  Cortisone injections to relieve severe pain.  Extracorporeal shock wave therapy (ESWT) to stimulate damaged plantar fascia with electrical impulses. It is often used as a last resort before surgery.  Surgery, if other treatments have not worked after 12 months. HOME CARE INSTRUCTIONS  Take medicines only as directed by your health care provider.  Avoid activities that cause pain.  Roll the bottom of your foot over a bag of ice or a bottle of cold water. Do this for 20 minutes, 3-4 times a day.  Perform simple stretches as directed by your health care provider.  Try wearing athletic shoes with air-sole or gel-sole cushions or soft shoe inserts.  Wear a night splint while sleeping, if directed by your health care provider.  Keep all follow-up appointments with your health care provider. PREVENTION   Do not perform exercises or activities that cause heel pain.  Consider finding low-impact activities if you continue to have problems.  Lose weight if you need to. The best way to prevent plantar fasciitis is to avoid the activities that aggravate your plantar fascia. SEEK MEDICAL CARE IF:  Your symptoms do not go away after treatment with home care measures.  Your pain gets worse.  Your pain affects your ability to move or do your daily activities.   This information is not intended to replace advice given to you by your health care provider. Make sure you discuss any questions you have with your health care provider.   Document Released: 12/10/2000 Document Revised: 12/06/2014 Document Reviewed: 01/25/2014 Elsevier   Interactive Patient Education 2016 [employer]vier Inc.  

## 2015-05-07 NOTE — Progress Notes (Signed)
Pre visit review using our clinic review tool, if applicable. No additional management support is needed unless otherwise documented below in the visit note. 

## 2015-05-14 LAB — HM COLONOSCOPY: HM Colonoscopy: NORMAL

## 2015-05-21 ENCOUNTER — Encounter: Payer: Self-pay | Admitting: Internal Medicine

## 2015-10-10 ENCOUNTER — Telehealth: Payer: Self-pay | Admitting: Internal Medicine

## 2015-10-10 NOTE — Telephone Encounter (Signed)
Patient would like a referral to a Podiatrist because the plantar fasciitis is not getting any better.

## 2015-10-11 NOTE — Telephone Encounter (Signed)
She needs to make a follow up appt for this. It has been 5 months since we discussed last. I can place referral at follow up appt

## 2015-10-12 NOTE — Telephone Encounter (Signed)
Left message on voicemail.

## 2015-10-15 ENCOUNTER — Ambulatory Visit (INDEPENDENT_AMBULATORY_CARE_PROVIDER_SITE_OTHER): Payer: 59 | Admitting: Internal Medicine

## 2015-10-15 ENCOUNTER — Encounter: Payer: Self-pay | Admitting: Internal Medicine

## 2015-10-15 VITALS — BP 104/68 | HR 82 | Temp 99.0°F | Wt 133.0 lb

## 2015-10-15 DIAGNOSIS — M722 Plantar fascial fibromatosis: Secondary | ICD-10-CM | POA: Diagnosis not present

## 2015-10-15 NOTE — Progress Notes (Signed)
   Subjective:    Patient ID: Teresa Wilkerson, female    DOB: 1965/11/27, 50 y.o.   MRN: [id number]  HPI  Pt presents to the clinic today for follow-up of right plantar fasciitis.  Symptoms began last fall and have persisted.  Last week she went to the zoo and did a lot of walking, and the heel pain has been worse since then.  She reports constant dully, achy pain at a severity of 5/10 with occasional stabbing pain when walking and radiation to the ankle.  She takes Meloxicam as needed with relief lasting a few days, and has had some relief with stretches, new shoes, and rolling over a ball.  She has also tried ibuprofen without relief.  She denies numbness, tingling, or decreased range of motion.  She is interested in trying some custom orthotics.    Review of Systems  History reviewed. No pertinent past medical history.  Current Outpatient Prescriptions  Medication Sig Dispense Refill  . fluticasone (FLONASE) 50 MCG/ACT nasal spray Place 2 sprays into both nostrils daily.    . meloxicam (MOBIC) 15 MG tablet Take 1 tablet (15 mg total) by mouth daily. 30 tablet 2   No current facility-administered medications for this visit.    Allergies  Allergen Reactions  . Amoxicillin   . Sulfonamide Derivatives     Family History  Problem Relation Age of Onset  . Hyperlipidemia Father   . Coronary artery disease      GM with CABG  . Hyperlipidemia      Uncles and aunts  . Colon cancer Paternal Grandfather   . Cancer Paternal Grandfather     colon and lung  . Diabetes Neg Hx     Social History   Social History  . Marital Status: Married    Spouse Name: N/A  . Number of Children: 1  . Years of Education: N/A   Occupational History  . Not on file.   Social History Main Topics  . Smoking status: Never Smoker   . Smokeless tobacco: Never Used  . Alcohol Use: No  . Drug Use: No  . Sexual Activity: Yes    Birth Control/ Protection: None   Other Topics Concern  . Not on file     Social History Narrative   No caffeine    MSK: Admits to right heel pain that radiates to the ankle.  Denies decreased range of motion. Neuro: Denies numbness or tingling.   No other specific complaints in a complete review of systems (except as listed in HPI above).      Objective:   Physical Exam BP 104/68 mmHg  Pulse 82  Temp(Src) 99 F (37.2 C) (Oral)  Wt 133 lb (60.328 kg)  SpO2 99%  General: Well-appearing, appears stated age, in no acute distress. MSK:  Right heel tender to palpation.  Full AROM bilateral ankles, pain in right heel with dorsiflexion and plantar flexion of right foot.  Strength 5/5 both feet, pain when testing dorsiflexion strength right foot.  Neuro: Gait appropriate.  Sensation to light touch intact.      Assessment & Plan:   Right Plantar Fasciitis:  Referral given to Orthopedist for further evaluation, custom orthotics Continue Meloxicam as needed for pain  RTC as needed or if symptoms persist or worsen

## 2015-10-15 NOTE — Patient Instructions (Signed)

## 2015-10-15 NOTE — Progress Notes (Signed)
Pre visit review using our clinic review tool, if applicable. No additional management support is needed unless otherwise documented below in the visit note. 

## 2016-01-07 ENCOUNTER — Ambulatory Visit (INDEPENDENT_AMBULATORY_CARE_PROVIDER_SITE_OTHER): Payer: 59

## 2016-01-07 DIAGNOSIS — Z23 Encounter for immunization: Secondary | ICD-10-CM

## 2016-02-11 ENCOUNTER — Other Ambulatory Visit: Payer: Self-pay | Admitting: Internal Medicine

## 2016-03-19 LAB — HM PAP SMEAR: HM Pap smear: NORMAL

## 2016-03-19 LAB — HM MAMMOGRAPHY

## 2016-09-02 ENCOUNTER — Encounter: Payer: Self-pay | Admitting: Internal Medicine

## 2016-09-02 ENCOUNTER — Ambulatory Visit (INDEPENDENT_AMBULATORY_CARE_PROVIDER_SITE_OTHER): Payer: 59 | Admitting: Internal Medicine

## 2016-09-02 VITALS — BP 104/68 | HR 58 | Temp 98.0°F | Ht 64.0 in | Wt 134.0 lb

## 2016-09-02 DIAGNOSIS — L719 Rosacea, unspecified: Secondary | ICD-10-CM

## 2016-09-02 DIAGNOSIS — Z0001 Encounter for general adult medical examination with abnormal findings: Secondary | ICD-10-CM | POA: Diagnosis not present

## 2016-09-02 DIAGNOSIS — Z0000 Encounter for general adult medical examination without abnormal findings: Secondary | ICD-10-CM

## 2016-09-02 DIAGNOSIS — Z1159 Encounter for screening for other viral diseases: Secondary | ICD-10-CM

## 2016-09-02 DIAGNOSIS — Z23 Encounter for immunization: Secondary | ICD-10-CM

## 2016-09-02 DIAGNOSIS — J301 Allergic rhinitis due to pollen: Secondary | ICD-10-CM | POA: Diagnosis not present

## 2016-09-02 DIAGNOSIS — Z114 Encounter for screening for human immunodeficiency virus [HIV]: Secondary | ICD-10-CM

## 2016-09-02 LAB — COMPREHENSIVE METABOLIC PANEL
ALT: 14 U/L (ref 0–35)
AST: 13 U/L (ref 0–37)
Albumin: 4.2 g/dL (ref 3.5–5.2)
Alkaline Phosphatase: 60 U/L (ref 39–117)
BILIRUBIN TOTAL: 0.5 mg/dL (ref 0.2–1.2)
BUN: 18 mg/dL (ref 6–23)
CO2: 27 meq/L (ref 19–32)
CREATININE: 0.79 mg/dL (ref 0.40–1.20)
Calcium: 9.2 mg/dL (ref 8.4–10.5)
Chloride: 105 mEq/L (ref 96–112)
GFR: 81.05 mL/min (ref >60.00)
GLUCOSE: 94 mg/dL (ref 70–99)
Potassium: 4.1 mEq/L (ref 3.5–5.1)
Sodium: 138 mEq/L (ref 135–145)
Total Protein: 7 g/dL (ref 6.0–8.3)

## 2016-09-02 LAB — LIPID PANEL
Cholesterol: 165 mg/dL (ref 0–200)
HDL: 55.9 mg/dL (ref >39.00)
LDL CALC: 94 mg/dL (ref 0–99)
NonHDL: 109.52
Total CHOL/HDL Ratio: 3
Triglycerides: 78 mg/dL (ref 0.0–149.0)
VLDL: 15.6 mg/dL (ref 0.0–40.0)

## 2016-09-02 LAB — CBC
HCT: 38.8 % (ref 36.0–46.0)
Hemoglobin: 13 g/dL (ref 12.0–15.0)
MCHC: 33.5 g/dL (ref 30.0–36.0)
MCV: 88.6 fl (ref 78.0–100.0)
PLATELETS: 209 10*3/uL (ref 150.0–400.0)
RBC: 4.38 Mil/uL (ref 3.87–5.11)
RDW: 14.3 % (ref 11.5–15.5)
WBC: 7.6 10*3/uL (ref 4.0–10.5)

## 2016-09-02 LAB — VITAMIN D 25 HYDROXY (VIT D DEFICIENCY, FRACTURES): VITD: 20.83 ng/mL — AB (ref 30.00–100.00)

## 2016-09-02 MED ORDER — METRONIDAZOLE 0.75 % EX GEL: 1.0000 "application " | Freq: Two times a day (BID) | CUTANEOUS | 0 refills | Status: DC

## 2016-09-02 MED ORDER — FLUTICASONE PROPIONATE 50 MCG/ACT NA SUSP: 2.0000 | Freq: Every day | NASAL | 11 refills | Status: AC

## 2016-09-02 NOTE — Progress Notes (Signed)
Subjective:    Patient ID: Teresa Wilkerson, female    DOB: 08-04-1965, 51 y.o.   MRN: [id number]  HPI  Pt presents to the clinic today for her annual exam. She is requesting a refill of her Flonase today.  Flu: 12/2015 Tetanus: > 10 years ago Pap Smear: 02/2016, [facility] Mammogram: 02/2016, [facility] Colon Screening: 05/2015 Vision Screening: yearly Dentist: biannually  Diet: She does eat meat. She consumes more veggies than fruit. She tries to avoid fried foods. She drinks mostly water and sweet tea. Exercise: She runs on a treadmill for 30 minutes 5 days a week  Review of Systems  No past medical history on file.  Current Outpatient Prescriptions  Medication Sig Dispense Refill  . fluticasone (FLONASE) 50 MCG/ACT nasal spray Place 2 sprays into both nostrils daily.    . meloxicam (MOBIC) 15 MG tablet TAKE 1 TABLET (15 MG TOTAL) BY MOUTH DAILY. 30 tablet 0   No current facility-administered medications for this visit.     Allergies  Allergen Reactions  . Amoxicillin   . Sulfonamide Derivatives     Family History  Problem Relation Age of Onset  . Hyperlipidemia Father   . Coronary artery disease Unknown        GM with CABG  . Hyperlipidemia Unknown        Uncles and aunts  . Colon cancer Paternal Grandfather   . Cancer Paternal Grandfather        colon and lung  . Diabetes Neg Hx     Social History   Social History  . Marital status: Married    Spouse name: N/A  . Number of children: 1  . Years of education: N/A   Occupational History  . Not on file.   Social History Main Topics  . Smoking status: Never Smoker  . Smokeless tobacco: Never Used  . Alcohol use No  . Drug use: No  . Sexual activity: Yes    Birth control/ protection: None   Other Topics Concern  . Not on file   Social History Narrative   No caffeine     Constitutional: Denies fever, malaise, fatigue, headache or abrupt weight changes.  HEENT: Denies eye  pain, eye redness, ear pain, ringing in the ears, wax buildup, runny nose, nasal congestion, bloody nose, or sore throat. Respiratory: Denies difficulty breathing, shortness of breath, cough or sputum production.   Cardiovascular: Denies chest pain, chest tightness, palpitations or swelling in the hands or feet.  Gastrointestinal: Denies abdominal pain, bloating, constipation, diarrhea or blood in the stool.  GU: Denies urgency, frequency, pain with urination, burning sensation, blood in urine, odor or discharge. Musculoskeletal: Denies decrease in range of motion, difficulty with gait, muscle pain or joint pain and swelling.  Skin: pt reports rash on face. Denies lesions or ulcercations.  Neurological: Denies dizziness, difficulty with memory, difficulty with speech or problems with balance and coordination.  Psych: Denies anxiety, depression, SI/HI.  No other specific complaints in a complete review of systems (except as listed in HPI above).     Objective:   Physical Exam  BP 104/68   Pulse (!) 58   Temp 98 F (36.7 C) (Oral)   Ht 5\' 4"  (1.626 m)   Wt 134 lb (60.8 kg)   LMP 08/24/2016 Comment: irregular  SpO2 98%   BMI 23.00 kg/m  Wt Readings from Last 3 Encounters:  09/02/16 134 lb (60.8 kg)  10/15/15 133 lb (60.3 kg)  05/07/15 132 lb (59.9 kg)    General: Appears her stated age, well developed, well nourished in NAD. Skin: Warm, dry and intact. Redness noted on bilateral cheeks. HEENT: Head: normal shape and size; Eyes: sclera Debarr, no icterus, conjunctiva pink, PERRLA and EOMs intact; Ears: Tm's gray and intact, normal light reflex; Throat/Mouth: Teeth present, mucosa pink and moist, no exudate, lesions or ulcerations noted.  Neck:  Neck supple, trachea midline. No masses, lumps or thyromegaly present.  Cardiovascular: Normal rate and rhythm. S1,S2 noted.  No murmur, rubs or gallops noted. No JVD or BLE edema. No carotid bruits noted. Pulmonary/Chest: Normal effort and  positive vesicular breath sounds. No respiratory distress. No wheezes, rales or ronchi noted.  Abdomen: Soft and nontender. Normal bowel sounds. No distention or masses noted. Liver, spleen and kidneys non palpable. Musculoskeletal: Strength 5/5 BUE/BLE. No difficulty with gait.  Neurological: Alert and oriented. Cranial nerves II-XII grossly intact. Coordination normal.  Psychiatric: Mood and affect normal. Behavior is normal. Judgment and thought content normal.       Assessment & Plan:   Preventative Health Maintenance:  Encouraged her to get a flu shot in the fall Tdap today Mammogram and pap smear UTD Colon screening UTD Encouraged her to consume a balanced diet and exercise regimen Advised her to see an eye doctor and dentist annually Will check CBC, CMET. Lipid, Vit D, HIV and Hep C today  Rosacea:  Will try Metrogel  Seasonal Allergies:  Flonase refilled today  RTC in 1 year, sooner if needed

## 2016-09-02 NOTE — Patient Instructions (Signed)

## 2016-09-03 LAB — HIV ANTIBODY (ROUTINE TESTING W REFLEX): HIV 1&2 Ab, 4th Generation: NONREACTIVE

## 2016-09-03 LAB — HEPATITIS C ANTIBODY: HCV Ab: NEGATIVE

## 2016-09-04 NOTE — Addendum Note (Signed)
Addended by: Littie DeedsEVONTENNO, Parnika Tweten on: 09/04/2016 04:56 PM   Modules accepted: Orders

## 2016-10-14 ENCOUNTER — Encounter: Payer: Self-pay | Admitting: Internal Medicine

## 2016-10-16 ENCOUNTER — Ambulatory Visit (INDEPENDENT_AMBULATORY_CARE_PROVIDER_SITE_OTHER): Payer: 59 | Admitting: Internal Medicine

## 2016-10-16 ENCOUNTER — Encounter: Payer: Self-pay | Admitting: Internal Medicine

## 2016-10-16 VITALS — BP 106/70 | HR 53 | Temp 98.1°F | Wt 136.0 lb

## 2016-10-16 DIAGNOSIS — R6889 Other general symptoms and signs: Secondary | ICD-10-CM | POA: Diagnosis not present

## 2016-10-16 DIAGNOSIS — R5383 Other fatigue: Secondary | ICD-10-CM | POA: Diagnosis not present

## 2016-10-16 DIAGNOSIS — R635 Abnormal weight gain: Secondary | ICD-10-CM

## 2016-10-16 DIAGNOSIS — R946 Abnormal results of thyroid function studies: Secondary | ICD-10-CM

## 2016-10-16 DIAGNOSIS — R7989 Other specified abnormal findings of blood chemistry: Secondary | ICD-10-CM

## 2016-10-16 LAB — T4, FREE: Free T4: 0.73 ng/dL (ref 0.60–1.60)

## 2016-10-16 LAB — TSH: TSH: 4.76 u[IU]/mL — ABNORMAL HIGH (ref 0.35–4.50)

## 2016-10-16 NOTE — Patient Instructions (Signed)
Hypothyroidism Hypothyroidism is a disorder of the thyroid. The thyroid is a large gland that is located in the lower front of the neck. The thyroid releases hormones that control how the body works. With hypothyroidism, the thyroid does not make enough of these hormones. What are the causes? Causes of hypothyroidism may include:  Viral infections.  Pregnancy.  Your own defense system (immune system) attacking your thyroid.  Certain medicines.  Birth defects.  Past radiation treatments to your head or neck.  Past treatment with radioactive iodine.  Past surgical removal of part or all of your thyroid.  Problems with the gland that is located in the center of your brain (pituitary).  What are the signs or symptoms? Signs and symptoms of hypothyroidism may include:  Feeling as though you have no energy (lethargy).  Inability to tolerate cold.  Weight gain that is not explained by a change in diet or exercise habits.  Dry skin.  Coarse hair.  Menstrual irregularity.  Slowing of thought processes.  Constipation.  Sadness or depression.  How is this diagnosed? Your health care provider may diagnose hypothyroidism with blood tests and ultrasound tests. How is this treated? Hypothyroidism is treated with medicine that replaces the hormones that your body does not make. After you begin treatment, it may take several weeks for symptoms to go away. Follow these instructions at home:  Take medicines only as directed by your health care provider.  If you start taking any new medicines, tell your health care provider.  Keep all follow-up visits as directed by your health care provider. This is important. As your condition improves, your dosage needs may change. You will need to have blood tests regularly so that your health care provider can watch your condition. Contact a health care provider if:  Your symptoms do not get better with treatment.  You are taking thyroid  replacement medicine and: ? You sweat excessively. ? You have tremors. ? You feel anxious. ? You lose weight rapidly. ? You cannot tolerate heat. ? You have emotional swings. ? You have diarrhea. ? You feel weak. Get help right away if:  You develop chest pain.  You develop an irregular heartbeat.  You develop a rapid heartbeat. This information is not intended to replace advice given to you by your health care provider. Make sure you discuss any questions you have with your health care provider. Document Released: 03/17/2005 Document Revised: 08/23/2015 Document Reviewed: 08/02/2013 Elsevier Interactive Patient Education  2017 Elsevier Inc.  

## 2016-10-16 NOTE — Progress Notes (Signed)
Subjective:    Patient ID: Teresa Wilkerson, female    DOB: January 18, 1966, 51 y.o.   MRN: [id number]  HPI  Pt presents to the clinic today to follow up abnormal TSH. She had this drawn by Dr. Jennette KettleNeal 10/09/16. Her TSH was 5.21. No Free T4 was drawn. She has never had an issue with her thyroid in the past. She reports cold intolerance, fatigue and weight gain.  Review of Systems  No past medical history on file.  Current Outpatient Prescriptions  Medication Sig Dispense Refill  . Calcium Carb-Cholecalciferol (CALCIUM 600/VITAMIN D3) 600-800 MG-UNIT TABS Take 1 tablet by mouth daily.    Marland Kitchen. EPINEPHrine 0.3 mg/0.3 mL IJ SOAJ injection     . fluticasone (FLONASE) 50 MCG/ACT nasal spray Place 2 sprays into both nostrils daily. 16 g 11  . meloxicam (MOBIC) 15 MG tablet TAKE 1 TABLET (15 MG TOTAL) BY MOUTH DAILY. 30 tablet 0  . metroNIDAZOLE (METROGEL) 0.75 % gel Apply 1 application topically 2 (two) times daily. 45 g 0  . Multiple Vitamins-Minerals (CENTRUM SILVER 50+WOMEN) TABS Take 1 tablet by mouth daily.     No current facility-administered medications for this visit.     Allergies  Allergen Reactions  . Amoxicillin   . Sulfonamide Derivatives     Family History  Problem Relation Age of Onset  . Hyperlipidemia Father   . Coronary artery disease Unknown        GM with CABG  . Hyperlipidemia Unknown        Uncles and aunts  . Colon cancer Paternal Grandfather   . Cancer Paternal Grandfather        colon and lung  . Diabetes Neg Hx     Social History   Social History  . Marital status: Married    Spouse name: N/A  . Number of children: 1  . Years of education: N/A   Occupational History  . Not on file.   Social History Main Topics  . Smoking status: Never Smoker  . Smokeless tobacco: Never Used  . Alcohol use No  . Drug use: No  . Sexual activity: Yes    Birth control/ protection: None   Other Topics Concern  . Not on file   Social History Narrative   No caffeine       Constitutional: Pt reports fatigue and weight gain. Denies fever, malaise, fatigue, headache or abrupt weight changes.  Gastrointestinal: Denies abdominal pain, bloating, constipation, diarrhea or blood in the stool.  Skin: Denies redness, rashes, lesions or ulcercations.  Neurological: Pt reports cold intolerance. Denies dizziness, difficulty with memory, difficulty with speech or problems with balance and coordination.  Psych: Denies anxiety, depression, SI/HI.  No other specific complaints in a complete review of systems (except as listed in HPI above).     Objective:   Physical Exam   BP 106/70   Pulse (!) 53   Temp 98.1 F (36.7 C) (Oral)   Wt 136 lb (61.7 kg)   SpO2 100%   BMI 23.34 kg/m  Wt Readings from Last 3 Encounters:  10/16/16 136 lb (61.7 kg)  09/02/16 134 lb (60.8 kg)  10/15/15 133 lb (60.3 kg)    General: Appears her stated age, well developed, well nourished in NAD. Neck:  Neck supple, trachea midline. No masses, lumps or thyromegaly present.  Cardiovascular: Normal rate and rhythm. S1,S2 noted.  No murmur, rubs or gallops noted. No JVD or BLE edema. No carotid bruits noted. Neurological: Alert and  oriented.    BMET    Component Value Date/Time   NA 138 09/02/2016 1401   K 4.1 09/02/2016 1401   CL 105 09/02/2016 1401   CO2 27 09/02/2016 1401   GLUCOSE 94 09/02/2016 1401   BUN 18 09/02/2016 1401   CREATININE 0.79 09/02/2016 1401   CALCIUM 9.2 09/02/2016 1401    Lipid Panel     Component Value Date/Time   CHOL 165 09/02/2016 1401   TRIG 78.0 09/02/2016 1401   HDL 55.90 09/02/2016 1401   CHOLHDL 3 09/02/2016 1401   VLDL 15.6 09/02/2016 1401   LDLCALC 94 09/02/2016 1401    CBC    Component Value Date/Time   WBC 7.6 09/02/2016 1401   RBC 4.38 09/02/2016 1401   HGB 13.0 09/02/2016 1401   HCT 38.8 09/02/2016 1401   PLT 209.0 09/02/2016 1401   MCV 88.6 09/02/2016 1401   MCHC 33.5 09/02/2016 1401   RDW 14.3 09/02/2016 1401    Hgb  A1C No results found for: HGBA1C         Assessment & Plan:   Abnormal TSH, Fatigue, Weight Gain, Cold Intolerance:  Will repeat TSH and add Free T4 today  Will follow up after labs, return precautions discussed Nicki Reaper, NP

## 2017-02-13 ENCOUNTER — Ambulatory Visit (INDEPENDENT_AMBULATORY_CARE_PROVIDER_SITE_OTHER): Payer: 59

## 2017-02-13 DIAGNOSIS — Z23 Encounter for immunization: Secondary | ICD-10-CM

## 2018-01-21 ENCOUNTER — Ambulatory Visit (INDEPENDENT_AMBULATORY_CARE_PROVIDER_SITE_OTHER): Payer: 59

## 2018-01-21 DIAGNOSIS — Z23 Encounter for immunization: Secondary | ICD-10-CM

## 2018-03-10 ENCOUNTER — Ambulatory Visit (INDEPENDENT_AMBULATORY_CARE_PROVIDER_SITE_OTHER): Payer: 59 | Admitting: Internal Medicine

## 2018-03-10 ENCOUNTER — Encounter: Payer: Self-pay | Admitting: Internal Medicine

## 2018-03-10 VITALS — BP 104/68 | HR 56 | Temp 98.1°F | Ht 64.0 in | Wt 134.0 lb

## 2018-03-10 DIAGNOSIS — Z0000 Encounter for general adult medical examination without abnormal findings: Secondary | ICD-10-CM | POA: Diagnosis not present

## 2018-03-10 NOTE — Progress Notes (Signed)
Subjective:    Patient ID: Teresa Wilkerson, female    DOB: Jul 20, 1965, 52 y.o.   MRN: [id number]  HPI  Pt presents to the clinic today for her annual exam.  Flu: 12/2017 Tetanus: 08/2016 Pap Smear: 02/2017 Mammogram: 02/2017 Colon Screening: 05/2015 Vision Screening: annually Dentist: biannually  Diet: She does eat some meat. She consumes fruits and veggies daily. She tries to avoid fried foods. She drinks mostly water, green tea, sweet tea and hot tea. Exercise: Gym, light weights or treadmill 3 days per week, 3-4 days per week  Review of Systems      No past medical history on file.  Current Outpatient Medications  Medication Sig Dispense Refill  . Calcium Carb-Cholecalciferol (CALCIUM 600/VITAMIN D3) 600-800 MG-UNIT TABS Take 1 tablet by mouth daily.    Marland Kitchen EPINEPHrine 0.3 mg/0.3 mL IJ SOAJ injection     . fluticasone (FLONASE) 50 MCG/ACT nasal spray Place 2 sprays into both nostrils daily. 16 g 11  . meloxicam (MOBIC) 15 MG tablet TAKE 1 TABLET (15 MG TOTAL) BY MOUTH DAILY. 30 tablet 0  . Multiple Vitamins-Minerals (CENTRUM SILVER 50+WOMEN) TABS Take 1 tablet by mouth daily.     No current facility-administered medications for this visit.     Allergies  Allergen Reactions  . Amoxicillin   . Sulfonamide Derivatives     Family History  Problem Relation Age of Onset  . Hyperlipidemia Father   . Coronary artery disease Unknown        GM with CABG  . Hyperlipidemia Unknown        Uncles and aunts  . Colon cancer Paternal Grandfather   . Cancer Paternal Grandfather        colon and lung  . Diabetes Neg Hx     Social History   Socioeconomic History  . Marital status: Married    Spouse name: Not on file  . Number of children: 1  . Years of education: Not on file  . Highest education level: Not on file  Occupational History  . Not on file  Social Needs  . Financial resource strain: Not on file  . Food insecurity:    Worry: Not on file    Inability: Not on  file  . Transportation needs:    Medical: Not on file    Non-medical: Not on file  Tobacco Use  . Smoking status: Never Smoker  . Smokeless tobacco: Never Used  Substance and Sexual Activity  . Alcohol use: No    Alcohol/week: 0.0 standard drinks  . Drug use: No  . Sexual activity: Yes    Birth control/protection: None  Lifestyle  . Physical activity:    Days per week: Not on file    Minutes per session: Not on file  . Stress: Not on file  Relationships  . Social connections:    Talks on phone: Not on file    Gets together: Not on file    Attends religious service: Not on file    Active member of club or organization: Not on file    Attends meetings of clubs or organizations: Not on file    Relationship status: Not on file  . Intimate partner violence:    Fear of current or ex partner: Not on file    Emotionally abused: Not on file    Physically abused: Not on file    Forced sexual activity: Not on file  Other Topics Concern  . Not on file  Social History  Narrative   No caffeine     Constitutional: Denies fever, malaise, fatigue, headache or abrupt weight changes.  HEENT: Denies eye pain, eye redness, ear pain, ringing in the ears, wax buildup, runny nose, nasal congestion, bloody nose, or sore throat. Respiratory: Denies difficulty breathing, shortness of breath, cough or sputum production.   Cardiovascular: Denies chest pain, chest tightness, palpitations or swelling in the hands or feet.  Gastrointestinal: Denies abdominal pain, bloating, constipation, diarrhea or blood in the stool.  GU: Denies urgency, frequency, pain with urination, burning sensation, blood in urine, odor or discharge. Musculoskeletal: Pt reports intermittent joint pain. Denies decrease in range of motion, difficulty with gait, muscle pain or joint swelling.  Skin: Denies redness, rashes, lesions or ulcercations.  Neurological: Denies dizziness, difficulty with memory, difficulty with speech or  problems with balance and coordination.  Psych: Denies anxiety, depression, SI/HI.  No other specific complaints in a complete review of systems (except as listed in HPI above).  Objective:   Physical Exam  BP 104/68   Pulse (!) 56   Temp 98.1 F (36.7 C) (Oral)   Ht 5\' 4"  (1.626 m)   Wt 134 lb (60.8 kg)   SpO2 98%   BMI 23.00 kg/m  Wt Readings from Last 3 Encounters:  03/10/18 134 lb (60.8 kg)  10/16/16 136 lb (61.7 kg)  09/02/16 134 lb (60.8 kg)    General: Appears her stated age, well developed, well nourished in NAD. Skin: Warm, dry and intact. HEENT: Head: normal shape and size; Eyes: sclera [place], no icterus, conjunctiva pink, PERRLA and EOMs intact; Ears: Tm's gray and intact, normal light reflex;  Throat/Mouth: Teeth present, mucosa pink and moist, = PND, no exudate, lesions or ulcerations noted.  Neck:  Neck supple, trachea midline. No masses, lumps or thyromegaly present.  Cardiovascular: Normal rate and rhythm. S1,S2 noted.  No murmur, rubs or gallops noted. No JVD or BLE edema. No carotid bruits noted. Pulmonary/Chest: Normal effort and positive vesicular breath sounds. No respiratory distress. No wheezes, rales or ronchi noted.  Abdomen: Soft and nontender. Normal bowel sounds. No distention or masses noted. Liver, spleen and kidneys non palpable. Musculoskeletal: Strength 5/5 BUE/BLE No difficulty with gait.  Neurological: Alert and oriented. Cranial nerves II-XII grossly intact. Coordination normal.  Psychiatric: Mood and affect normal. Behavior is normal. Judgment and thought content normal.    BMET    Component Value Date/Time   NA 138 09/02/2016 1401   K 4.1 09/02/2016 1401   CL 105 09/02/2016 1401   CO2 27 09/02/2016 1401   GLUCOSE 94 09/02/2016 1401   BUN 18 09/02/2016 1401   CREATININE 0.79 09/02/2016 1401   CALCIUM 9.2 09/02/2016 1401    Lipid Panel     Component Value Date/Time   CHOL 165 09/02/2016 1401   TRIG 78.0 09/02/2016 1401   HDL  55.90 09/02/2016 1401   CHOLHDL 3 09/02/2016 1401   VLDL 15.6 09/02/2016 1401   LDLCALC 94 09/02/2016 1401    CBC    Component Value Date/Time   WBC 7.6 09/02/2016 1401   RBC 4.38 09/02/2016 1401   HGB 13.0 09/02/2016 1401   HCT 38.8 09/02/2016 1401   PLT 209.0 09/02/2016 1401   MCV 88.6 09/02/2016 1401   MCHC 33.5 09/02/2016 1401   RDW 14.3 09/02/2016 1401    Hgb A1C No results found for: HGBA1C          Assessment & Plan:   Preventative Health Maintenance:  Flu shot UTD  Tetanus UTD Pap smear and mammogram UTD, will request records Colon screening UTD Encouraged her to consume a balanced diet and exercise regimen Advised her to see an eye doctor and dentist annually Will check CBC, CMET, Lipid and Vit D today  RTC in 1 year, sooner if needed Nicki Reaper, NP

## 2018-03-10 NOTE — Patient Instructions (Signed)

## 2018-03-11 ENCOUNTER — Other Ambulatory Visit: Payer: Self-pay | Admitting: Internal Medicine

## 2018-03-11 DIAGNOSIS — E559 Vitamin D deficiency, unspecified: Secondary | ICD-10-CM

## 2018-03-11 LAB — LIPID PANEL
Cholesterol: 157 mg/dL (ref 0–200)
HDL: 52.4 mg/dL (ref >39.00)
LDL Cholesterol: 82 mg/dL (ref 0–99)
NonHDL: 104.54
Total CHOL/HDL Ratio: 3
Triglycerides: 115 mg/dL (ref 0.0–149.0)
VLDL: 23 mg/dL (ref 0.0–40.0)

## 2018-03-11 LAB — COMPREHENSIVE METABOLIC PANEL
ALT: 13 U/L (ref 0–35)
AST: 13 U/L (ref 0–37)
Albumin: 4.4 g/dL (ref 3.5–5.2)
Alkaline Phosphatase: 61 U/L (ref 39–117)
BUN: 16 mg/dL (ref 6–23)
CO2: 29 mEq/L (ref 19–32)
Calcium: 9.6 mg/dL (ref 8.4–10.5)
Chloride: 104 mEq/L (ref 96–112)
Creatinine, Ser: 0.8 mg/dL (ref 0.40–1.20)
GFR: 79.42 mL/min (ref >60.00)
Glucose, Bld: 95 mg/dL (ref 70–99)
Potassium: 4 mEq/L (ref 3.5–5.1)
Sodium: 139 mEq/L (ref 135–145)
Total Bilirubin: 0.4 mg/dL (ref 0.2–1.2)
Total Protein: 7.3 g/dL (ref 6.0–8.3)

## 2018-03-11 LAB — VITAMIN D 25 HYDROXY (VIT D DEFICIENCY, FRACTURES): VITD: 21.45 ng/mL — ABNORMAL LOW (ref 30.00–100.00)

## 2018-03-11 LAB — CBC
HCT: 39.5 % (ref 36.0–46.0)
Hemoglobin: 13.4 g/dL (ref 12.0–15.0)
MCHC: 33.8 g/dL (ref 30.0–36.0)
MCV: 86.2 fl (ref 78.0–100.0)
Platelets: 213 10*3/uL (ref 150.0–400.0)
RBC: 4.58 Mil/uL (ref 3.87–5.11)
RDW: 13.9 % (ref 11.5–15.5)
WBC: 6.3 10*3/uL (ref 4.0–10.5)

## 2018-03-11 MED ORDER — VITAMIN D (ERGOCALCIFEROL) 1.25 MG (50000 UNIT) PO CAPS: 50000.0000 [IU] | ORAL_CAPSULE | ORAL | 0 refills | Status: DC

## 2018-06-16 LAB — HM MAMMOGRAPHY

## 2018-06-17 LAB — HM PAP SMEAR: HM Pap smear: NORMAL

## 2019-01-12 ENCOUNTER — Telehealth: Payer: Self-pay | Admitting: Internal Medicine

## 2019-01-12 NOTE — Telephone Encounter (Signed)
Error

## 2019-01-20 ENCOUNTER — Ambulatory Visit: Payer: 59

## 2019-03-15 ENCOUNTER — Ambulatory Visit (INDEPENDENT_AMBULATORY_CARE_PROVIDER_SITE_OTHER): Payer: 59 | Admitting: Internal Medicine

## 2019-03-15 ENCOUNTER — Encounter: Payer: Self-pay | Admitting: Internal Medicine

## 2019-03-15 ENCOUNTER — Other Ambulatory Visit: Payer: Self-pay

## 2019-03-15 VITALS — BP 106/68 | HR 53 | Temp 97.6°F | Ht 64.0 in | Wt 131.0 lb

## 2019-03-15 DIAGNOSIS — Z0000 Encounter for general adult medical examination without abnormal findings: Secondary | ICD-10-CM

## 2019-03-15 DIAGNOSIS — E559 Vitamin D deficiency, unspecified: Secondary | ICD-10-CM | POA: Diagnosis not present

## 2019-03-15 LAB — LIPID PANEL
Cholesterol: 174 mg/dL (ref 0–200)
HDL: 54 mg/dL (ref >39.00)
LDL Cholesterol: 106 mg/dL — ABNORMAL HIGH (ref 0–99)
NonHDL: 120.02
Total CHOL/HDL Ratio: 3
Triglycerides: 72 mg/dL (ref 0.0–149.0)
VLDL: 14.4 mg/dL (ref 0.0–40.0)

## 2019-03-15 LAB — COMPREHENSIVE METABOLIC PANEL
ALT: 11 U/L (ref 0–35)
AST: 11 U/L (ref 0–37)
Albumin: 4.3 g/dL (ref 3.5–5.2)
Alkaline Phosphatase: 69 U/L (ref 39–117)
BUN: 23 mg/dL (ref 6–23)
CO2: 27 mEq/L (ref 19–32)
Calcium: 9.1 mg/dL (ref 8.4–10.5)
Chloride: 105 mEq/L (ref 96–112)
Creatinine, Ser: 0.85 mg/dL (ref 0.40–1.20)
GFR: 69.42 mL/min (ref >60.00)
Glucose, Bld: 90 mg/dL (ref 70–99)
Potassium: 4.2 mEq/L (ref 3.5–5.1)
Sodium: 139 mEq/L (ref 135–145)
Total Bilirubin: 0.5 mg/dL (ref 0.2–1.2)
Total Protein: 6.7 g/dL (ref 6.0–8.3)

## 2019-03-15 LAB — CBC
HCT: 38.6 % (ref 36.0–46.0)
Hemoglobin: 13.1 g/dL (ref 12.0–15.0)
MCHC: 33.8 g/dL (ref 30.0–36.0)
MCV: 87.1 fl (ref 78.0–100.0)
Platelets: 186 10*3/uL (ref 150.0–400.0)
RBC: 4.43 Mil/uL (ref 3.87–5.11)
RDW: 13.4 % (ref 11.5–15.5)
WBC: 5.1 10*3/uL (ref 4.0–10.5)

## 2019-03-15 LAB — VITAMIN D 25 HYDROXY (VIT D DEFICIENCY, FRACTURES): VITD: 19.32 ng/mL — ABNORMAL LOW (ref 30.00–100.00)

## 2019-03-15 NOTE — Progress Notes (Signed)
Subjective:    Patient ID: Teresa Wilkerson, female    DOB: [birth year], 53 y.o.   MRN: [id number]  HPI  Pt presents to the clinic today for her annual exam.  Flu: 01/2019 Tetanus: 08/2016 Pap Smear: 05/2018, [facility] Mammogram: 05/2018, [facility] Colon Screening: 05/2015, 10 years Vision Screening: annually Dentist: biannually  Diet: She does eat some meat. She consumes more veggies than fruits. She occasionally eats fried foods. She drinks mostly water, some chocolate milk. Exercise: exercise bike, weights about 4 days per week  Review of Systems   No past medical history on file.  Current Outpatient Medications  Medication Sig Dispense Refill  . Calcium Carb-Cholecalciferol (CALCIUM 600/VITAMIN D3) 600-800 MG-UNIT TABS Take 1 tablet by mouth daily.    Marland Kitchen EPINEPHrine 0.3 mg/0.3 mL IJ SOAJ injection     . fluticasone (FLONASE) 50 MCG/ACT nasal spray Place 2 sprays into both nostrils daily. 16 g 11  . meloxicam (MOBIC) 15 MG tablet TAKE 1 TABLET (15 MG TOTAL) BY MOUTH DAILY. 30 tablet 0  . Multiple Vitamins-Minerals (CENTRUM SILVER 50+WOMEN) TABS Take 1 tablet by mouth daily.    . Vitamin D, Ergocalciferol, (DRISDOL) 1.25 MG (50000 UT) CAPS capsule Take 1 capsule (50,000 Units total) by mouth every 7 (seven) days. 12 capsule 0   No current facility-administered medications for this visit.    Allergies  Allergen Reactions  . Amoxicillin   . Sulfonamide Derivatives     Family History  Problem Relation Age of Onset  . Hyperlipidemia Father   . Coronary artery disease Unknown        GM with CABG  . Hyperlipidemia Unknown        Uncles and aunts  . Colon cancer Paternal Grandfather   . Cancer Paternal Grandfather        colon and lung  . Diabetes Neg Hx     Social History   Socioeconomic History  . Marital status: Married    Spouse name: Not on file  . Number of children: 1  . Years of education: Not on file  . Highest education level: Not on  file  Occupational History  . Not on file  Tobacco Use  . Smoking status: Never Smoker  . Smokeless tobacco: Never Used  Substance and Sexual Activity  . Alcohol use: No    Alcohol/week: 0.0 standard drinks  . Drug use: No  . Sexual activity: Yes    Birth control/protection: None  Other Topics Concern  . Not on file  Social History Narrative   No caffeine   Social Determinants of Health   Financial Resource Strain:   . Difficulty of Paying Living Expenses: Not on file  Food Insecurity:   . Worried About Charity fundraiser in the Last Year: Not on file  . Ran Out of Food in the Last Year: Not on file  Transportation Needs:   . Lack of Transportation (Medical): Not on file  . Lack of Transportation (Non-Medical): Not on file  Physical Activity:   . Days of Exercise per Week: Not on file  . Minutes of Exercise per Session: Not on file  Stress:   . Feeling of Stress : Not on file  Social Connections:   . Frequency of Communication with Friends and Family: Not on file  . Frequency of Social Gatherings with Friends and Family: Not on file  . Attends Religious Services: Not on file  . Active Member of Clubs or Organizations:  Not on file  . Attends Banker Meetings: Not on file  . Marital Status: Not on file  Intimate Partner Violence:   . Fear of Current or Ex-Partner: Not on file  . Emotionally Abused: Not on file  . Physically Abused: Not on file  . Sexually Abused: Not on file     Constitutional: Denies fever, malaise, fatigue, headache or abrupt weight changes.  HEENT: Denies eye pain, eye redness, ear pain, ringing in the ears, wax buildup, runny nose, nasal congestion, bloody nose, or sore throat. Respiratory: Denies difficulty breathing, shortness of breath, cough or sputum production.   Cardiovascular: Denies chest pain, chest tightness, palpitations or swelling in the hands or feet.  Gastrointestinal: Denies abdominal pain, bloating, constipation,  diarrhea or blood in the stool.  GU: Denies urgency, frequency, pain with urination, burning sensation, blood in urine, odor or discharge. Musculoskeletal: Denies decrease in range of motion, difficulty with gait, muscle pain or joint pain and swelling.  Skin: Denies redness, rashes, lesions or ulcercations.  Neurological: Denies dizziness, difficulty with memory, difficulty with speech or problems with balance and coordination.  Psych: Denies anxiety, depression, SI/HI.  No other specific complaints in a complete review of systems (except as listed in HPI above).     Objective:   Physical Exam  BP 106/68   Pulse (!) 53   Temp 97.6 F (36.4 C) (Temporal)   Ht 5\' 4"  (1.626 m)   Wt 131 lb (59.4 kg)   SpO2 98%   BMI 22.49 kg/m   Wt Readings from Last 3 Encounters:  03/10/18 134 lb (60.8 kg)  10/16/16 136 lb (61.7 kg)  09/02/16 134 lb (60.8 kg)    General: Appears her stated age, well developed, well nourished in NAD. Skin: Warm, dry and intact. No rashes noted. HEENT: Head: normal shape and size; Eyes: sclera [place], no icterus, conjunctiva pink, PERRLA and EOMs intact;  Neck:  Neck supple, trachea midline. No masses, lumps or thyromegaly present.  Cardiovascular: Normal rate and rhythm. S1,S2 noted.  No murmur, rubs or gallops noted. No JVD or BLE edema. No carotid bruits noted. Pulmonary/Chest: Normal effort and positive vesicular breath sounds. No respiratory distress. No wheezes, rales or ronchi noted.  Abdomen: Soft and nontender. Normal bowel sounds. No distention or masses noted. Liver, spleen and kidneys non palpable. Musculoskeletal: Strength 5/5 BUE/BLE. No difficulty with gait.  Neurological: Alert and oriented. Cranial nerves II-XII grossly intact. Coordination normal.  Psychiatric: Mood and affect normal. Behavior is normal. Judgment and thought content normal.     BMET    Component Value Date/Time   NA 139 03/10/2018 1440   K 4.0 03/10/2018 1440   CL 104  03/10/2018 1440   CO2 29 03/10/2018 1440   GLUCOSE 95 03/10/2018 1440   BUN 16 03/10/2018 1440   CREATININE 0.80 03/10/2018 1440   CALCIUM 9.6 03/10/2018 1440    Lipid Panel     Component Value Date/Time   CHOL 157 03/10/2018 1440   TRIG 115.0 03/10/2018 1440   HDL 52.40 03/10/2018 1440   CHOLHDL 3 03/10/2018 1440   VLDL 23.0 03/10/2018 1440   LDLCALC 82 03/10/2018 1440    CBC    Component Value Date/Time   WBC 6.3 03/10/2018 1440   RBC 4.58 03/10/2018 1440   HGB 13.4 03/10/2018 1440   HCT 39.5 03/10/2018 1440   PLT 213.0 03/10/2018 1440   MCV 86.2 03/10/2018 1440   MCHC 33.8 03/10/2018 1440   RDW 13.9 03/10/2018  1440    Hgb A1C No results found for: HGBA1C         Assessment & Plan:   Preventative Health Maintenance:  Flu shot UTD Tetanus UTD Mammogram UTD, will request record Pap smear UTD, will request record Colon screening UTD Encouraged her to consume a balanced diet and exercise regimen Advised her to see an eye doctor and dentist annually Will check CBC, CMET, Lipid and Vit D today  RTC in 1 year, sooner if needed Nicki Reaperegina Diannah Rindfleisch, NP This visit occurred during the SARS-CoV-2 public health emergency.  Safety protocols were in place, including screening questions prior to the visit, additional usage of staff PPE, and extensive cleaning of exam room while observing appropriate contact time as indicated for disinfecting solutions.

## 2019-03-15 NOTE — Patient Instructions (Signed)

## 2019-03-16 MED ORDER — VITAMIN D (ERGOCALCIFEROL) 1.25 MG (50000 UNIT) PO CAPS: 50000.0000 [IU] | ORAL_CAPSULE | ORAL | 0 refills | Status: DC

## 2019-03-16 NOTE — Addendum Note (Signed)
Addended by: Jearld Fenton on: 03/16/2019 12:19 PM   Modules accepted: Orders

## 2019-03-17 ENCOUNTER — Encounter: Payer: Self-pay | Admitting: Internal Medicine

## 2019-06-28 ENCOUNTER — Other Ambulatory Visit: Payer: Self-pay | Admitting: Internal Medicine

## 2019-06-28 MED ORDER — MELOXICAM 15 MG PO TABS: 15.0000 mg | ORAL_TABLET | Freq: Every day | ORAL | 2 refills | Status: DC

## 2019-06-28 NOTE — Addendum Note (Signed)
Addended by: Lorre Munroe on: 06/28/2019 08:00 PM   Modules accepted: Orders

## 2019-06-28 NOTE — Telephone Encounter (Signed)
Meloxicam refilled  

## 2019-06-28 NOTE — Telephone Encounter (Signed)
Patient is out of Meloxicam. Patient uses it for Plantar Fasciitis.  She doesn't get it refilled often, but the last refill was at [facility] and she doesn't use that pharmacy anymore.  Can a rx be sent to [facility] in [place]?

## 2019-06-28 NOTE — Telephone Encounter (Signed)
Rx was last filled 2017... please advise med is on historical list

## 2019-06-30 ENCOUNTER — Other Ambulatory Visit (INDEPENDENT_AMBULATORY_CARE_PROVIDER_SITE_OTHER): Payer: 59

## 2019-06-30 ENCOUNTER — Other Ambulatory Visit: Payer: Self-pay

## 2019-06-30 DIAGNOSIS — E559 Vitamin D deficiency, unspecified: Secondary | ICD-10-CM

## 2019-06-30 LAB — VITAMIN D 25 HYDROXY (VIT D DEFICIENCY, FRACTURES): VITD: 30.71 ng/mL (ref 30.00–100.00)

## 2019-07-07 NOTE — Addendum Note (Signed)
Addended by: Roena Malady on: 07/07/2019 11:03 AM   Modules accepted: Orders

## 2020-03-19 ENCOUNTER — Encounter: Payer: 59 | Admitting: Internal Medicine

## 2020-03-26 ENCOUNTER — Encounter: Payer: Self-pay | Admitting: Internal Medicine

## 2020-03-26 ENCOUNTER — Ambulatory Visit (INDEPENDENT_AMBULATORY_CARE_PROVIDER_SITE_OTHER): Payer: 59 | Admitting: Internal Medicine

## 2020-03-26 ENCOUNTER — Other Ambulatory Visit: Payer: Self-pay

## 2020-03-26 VITALS — BP 100/60 | HR 61 | Temp 97.0°F | Ht 63.0 in | Wt 139.0 lb

## 2020-03-26 DIAGNOSIS — Z0000 Encounter for general adult medical examination without abnormal findings: Secondary | ICD-10-CM

## 2020-03-26 LAB — COMPREHENSIVE METABOLIC PANEL
ALT: 13 U/L (ref 0–35)
AST: 15 U/L (ref 0–37)
Albumin: 4 g/dL (ref 3.5–5.2)
Alkaline Phosphatase: 51 U/L (ref 39–117)
BUN: 17 mg/dL (ref 6–23)
CO2: 27 mEq/L (ref 19–32)
Calcium: 9 mg/dL (ref 8.4–10.5)
Chloride: 105 mEq/L (ref 96–112)
Creatinine, Ser: 0.84 mg/dL (ref 0.40–1.20)
GFR: 77.85 mL/min (ref >60.00)
Glucose, Bld: 100 mg/dL — ABNORMAL HIGH (ref 70–99)
Potassium: 3.7 mEq/L (ref 3.5–5.1)
Sodium: 139 mEq/L (ref 135–145)
Total Bilirubin: 0.3 mg/dL (ref 0.2–1.2)
Total Protein: 6.8 g/dL (ref 6.0–8.3)

## 2020-03-26 LAB — CBC
HCT: 37.3 % (ref 36.0–46.0)
Hemoglobin: 12.8 g/dL (ref 12.0–15.0)
MCHC: 34.4 g/dL (ref 30.0–36.0)
MCV: 87.3 fl (ref 78.0–100.0)
Platelets: 187 10*3/uL (ref 150.0–400.0)
RBC: 4.28 Mil/uL (ref 3.87–5.11)
RDW: 13.4 % (ref 11.5–15.5)
WBC: 4.7 10*3/uL (ref 4.0–10.5)

## 2020-03-26 LAB — LIPID PANEL
Cholesterol: 170 mg/dL (ref 0–200)
HDL: 59.3 mg/dL (ref >39.00)
LDL Cholesterol: 84 mg/dL (ref 0–99)
NonHDL: 110.76
Total CHOL/HDL Ratio: 3
Triglycerides: 134 mg/dL (ref 0.0–149.0)
VLDL: 26.8 mg/dL (ref 0.0–40.0)

## 2020-03-26 NOTE — Patient Instructions (Signed)

## 2020-03-26 NOTE — Progress Notes (Signed)
Subjective:    Patient ID: Teresa Wilkerson, female    DOB: [birth year], 54 y.o.   MRN: [id number]  HPI  Patient presents the clinic today for her annual exam.  Flu: 01/2020 Tetanus: 08/2016 Covid: Pfizer Pap smear: 05/2018 Mammogram: 2021, [facility] Colon screening: 06/2015 Vision screening: annually Dentist: biannually  Diet: She does eat meat. She consumes more veggies than fruits. She occassionally eats fried foods. She drinks mostly water, chocolate milk. Exercise: weights, you tube videos.   Review of Systems  No past medical history on file.  Current Outpatient Medications  Medication Sig Dispense Refill  . Calcium Carb-Cholecalciferol (CALCIUM 600/VITAMIN D3) 600-800 MG-UNIT TABS Take 1 tablet by mouth daily.    Marland Kitchen EPINEPHrine 0.3 mg/0.3 mL IJ SOAJ injection     . fluticasone (FLONASE) 50 MCG/ACT nasal spray Place 2 sprays into both nostrils daily. 16 g 11  . meloxicam (MOBIC) 15 MG tablet Take 1 tablet (15 mg total) by mouth daily. 30 tablet 2  . Multiple Vitamins-Minerals (CENTRUM SILVER 50+WOMEN) TABS Take 1 tablet by mouth daily.     No current facility-administered medications for this visit.    Allergies  Allergen Reactions  . Amoxicillin   . Sulfonamide Derivatives     Family History  Problem Relation Age of Onset  . Hyperlipidemia Father   . Coronary artery disease Unknown        GM with CABG  . Hyperlipidemia Unknown        Uncles and aunts  . Colon cancer Paternal Grandfather   . Cancer Paternal Grandfather        colon and lung  . Diabetes Neg Hx     Social History   Socioeconomic History  . Marital status: Married    Spouse name: Not on file  . Number of children: 1  . Years of education: Not on file  . Highest education level: Not on file  Occupational History  . Not on file  Tobacco Use  . Smoking status: Never Smoker  . Smokeless tobacco: Never Used  Substance and Sexual Activity  . Alcohol use: No    Alcohol/week:  0.0 standard drinks  . Drug use: No  . Sexual activity: Yes    Birth control/protection: None  Other Topics Concern  . Not on file  Social History Narrative   No caffeine   Social Determinants of Health   Financial Resource Strain: Not on file  Food Insecurity: Not on file  Transportation Needs: Not on file  Physical Activity: Not on file  Stress: Not on file  Social Connections: Not on file  Intimate Partner Violence: Not on file     Constitutional: Denies fever, malaise, fatigue, headache or abrupt weight changes.  HEENT: Denies eye pain, eye redness, ear pain, ringing in the ears, wax buildup, runny nose, nasal congestion, bloody nose, or sore throat. Respiratory: Denies difficulty breathing, shortness of breath, cough or sputum production.   Cardiovascular: Denies chest pain, chest tightness, palpitations or swelling in the hands or feet.  Gastrointestinal: Pt reports rare reflux. Denies abdominal pain, bloating, constipation, diarrhea or blood in the stool.  GU: Denies urgency, frequency, pain with urination, burning sensation, blood in urine, odor or discharge. Musculoskeletal: Denies decrease in range of motion, difficulty with gait, muscle pain or joint pain and swelling.  Skin: Denies redness, rashes, lesions or ulcercations.  Neurological: Denies dizziness, difficulty with memory, difficulty with speech or problems with balance and coordination.  Psych: Denies anxiety, depression,  SI/HI.  No other specific complaints in a complete review of systems (except as listed in HPI above).     Objective:   Physical Exam   BP 100/60 (BP Location: Right Arm, Patient Position: Sitting, Cuff Size: Normal)   Pulse 61   Temp (!) 97 F (36.1 C) (Temporal)   Ht _0  (1.6 m)   Wt 139 lb (63 kg)   SpO2 97%   BMI 24.62 kg/m   Wt Readings from Last 3 Encounters:  03/15/19 131 lb (59.4 kg)  03/10/18 134 lb (60.8 kg)  10/16/16 136 lb (61.7 kg)    General: Appears her stated  age, well developed, well nourished in NAD. Skin: Warm, dry and intact. No rashes, lesions or ulcerations noted. HEENT: Head: normal shape and size; Eyes: sclera Metheny, no icterus, conjunctiva pink, PERRLA and EOMs intact;  Neck:  Neck supple, trachea midline. No masses, lumps or thyromegaly present.  Cardiovascular: Normal rate and rhythm. S1,S2 noted.  No murmur, rubs or gallops noted. No JVD or BLE edema. No carotid bruits noted. Pulmonary/Chest: Normal effort and positive vesicular breath sounds. No respiratory distress. No wheezes, rales or ronchi noted.  Abdomen: Soft and nontender. Normal bowel sounds. No distention or masses noted. Liver, spleen and kidneys non palpable. Musculoskeletal: Strength 5/5 BUE/BLE. No signs of joint swelling. No difficulty with gait.  Neurological: Alert and oriented. Cranial nerves II-XII grossly intact. Coordination normal.  Psychiatric: Mood and affect normal. Behavior is normal. Judgment and thought content normal.    BMET    Component Value Date/Time   NA 139 03/15/2019 1107   K 4.2 03/15/2019 1107   CL 105 03/15/2019 1107   CO2 27 03/15/2019 1107   GLUCOSE 90 03/15/2019 1107   BUN 23 03/15/2019 1107   CREATININE 0.85 03/15/2019 1107   CALCIUM 9.1 03/15/2019 1107    Lipid Panel     Component Value Date/Time   CHOL 174 03/15/2019 1107   TRIG 72.0 03/15/2019 1107   HDL 54.00 03/15/2019 1107   CHOLHDL 3 03/15/2019 1107   VLDL 14.4 03/15/2019 1107   LDLCALC 106 (H) 03/15/2019 1107    CBC    Component Value Date/Time   WBC 5.1 03/15/2019 1107   RBC 4.43 03/15/2019 1107   HGB 13.1 03/15/2019 1107   HCT 38.6 03/15/2019 1107   PLT 186.0 03/15/2019 1107   MCV 87.1 03/15/2019 1107   MCHC 33.8 03/15/2019 1107   RDW 13.4 03/15/2019 1107    Hgb A1C No results found for: HGBA1C          Assessment & Plan:   Preventative Health Maintenance:  Flu shot UTD Tetanus UTD Covid UTD Pap smear UTD Mammogram UTD, will request  copy Colon screening UTD Encouraged her to consume a balanced diet and exercise regimen Advised her to see an eye doctor and dentist annually We will check CBC, C met, lipid today  RTC in 1 year, sooner if needed Webb Silversmith, NP

## 2021-02-01 ENCOUNTER — Ambulatory Visit: Payer: 59

## 2021-02-25 LAB — HM PAP SMEAR: HM Pap smear: NOT DETECTED

## 2021-02-25 LAB — RESULTS CONSOLE HPV: CHL HPV: NEGATIVE

## 2021-04-04 LAB — HM MAMMOGRAPHY

## 2021-04-05 ENCOUNTER — Encounter: Payer: 59 | Admitting: Family

## 2021-07-09 ENCOUNTER — Encounter: Payer: Self-pay | Admitting: Internal Medicine

## 2021-07-09 ENCOUNTER — Ambulatory Visit (INDEPENDENT_AMBULATORY_CARE_PROVIDER_SITE_OTHER): Payer: 59 | Admitting: Internal Medicine

## 2021-07-09 VITALS — BP 116/74 | HR 54 | Temp 97.1°F | Ht 63.25 in | Wt 132.0 lb

## 2021-07-09 DIAGNOSIS — Z0001 Encounter for general adult medical examination with abnormal findings: Secondary | ICD-10-CM | POA: Diagnosis not present

## 2021-07-09 NOTE — Patient Instructions (Signed)
Health Maintenance for Postmenopausal Women ?Menopause is a normal process in which your ability to get pregnant comes to an end. This process happens slowly over many months or years, usually between the ages of 48 and 55. Menopause is complete when you have missed your menstrual period for 12 months. ?It is important to talk with your health care provider about some of the most common conditions that affect women after menopause (postmenopausal women). These include heart disease, cancer, and bone loss (osteoporosis). Adopting a healthy lifestyle and getting preventive care can help to promote your health and wellness. The actions you take can also lower your chances of developing some of these common conditions. ?What are the signs and symptoms of menopause? ?During menopause, you may have the following symptoms: ?Hot flashes. These can be moderate or severe. ?Night sweats. ?Decrease in sex drive. ?Mood swings. ?Headaches. ?Tiredness (fatigue). ?Irritability. ?Memory problems. ?Problems falling asleep or staying asleep. ?Talk with your health care provider about treatment options for your symptoms. ?Do I need hormone replacement therapy? ?Hormone replacement therapy is effective in treating symptoms that are caused by menopause, such as hot flashes and night sweats. ?Hormone replacement carries certain risks, especially as you become older. If you are thinking about using estrogen or estrogen with progestin, discuss the benefits and risks with your health care provider. ?How can I reduce my risk for heart disease and stroke? ?The risk of heart disease, heart attack, and stroke increases as you age. One of the causes may be a change in the body's hormones during menopause. This can affect how your body uses dietary fats, triglycerides, and cholesterol. Heart attack and stroke are medical emergencies. There are many things that you can do to help prevent heart disease and stroke. ?Watch your blood pressure ?High  blood pressure causes heart disease and increases the risk of stroke. This is more likely to develop in people who have high blood pressure readings or are overweight. ?Have your blood pressure checked: ?Every 3-5 years if you are 18-39 years of age. ?Every year if you are 40 years old or older. ?Eat a healthy diet ? ?Eat a diet that includes plenty of vegetables, fruits, low-fat dairy products, and lean protein. ?Do not eat a lot of foods that are high in solid fats, added sugars, or sodium. ?Get regular exercise ?Get regular exercise. This is one of the most important things you can do for your health. Most adults should: ?Try to exercise for at least 150 minutes each week. The exercise should increase your heart rate and make you sweat (moderate-intensity exercise). ?Try to do strengthening exercises at least twice each week. Do these in addition to the moderate-intensity exercise. ?Spend less time sitting. Even light physical activity can be beneficial. ?Other tips ?Work with your health care provider to achieve or maintain a healthy weight. ?Do not use any products that contain nicotine or tobacco. These products include cigarettes, chewing tobacco, and vaping devices, such as e-cigarettes. If you need help quitting, ask your health care provider. ?Know your numbers. Ask your health care provider to check your cholesterol and your blood sugar (glucose). Continue to have your blood tested as directed by your health care provider. ?Do I need screening for cancer? ?Depending on your health history and family history, you may need to have cancer screenings at different stages of your life. This may include screening for: ?Breast cancer. ?Cervical cancer. ?Lung cancer. ?Colorectal cancer. ?What is my risk for osteoporosis? ?After menopause, you may be   at increased risk for osteoporosis. Osteoporosis is a condition in which bone destruction happens more quickly than new bone creation. To help prevent osteoporosis or  the bone fractures that can happen because of osteoporosis, you may take the following actions: ?If you are 19-50 years old, get at least 1,000 mg of calcium and at least 600 international units (IU) of vitamin D per day. ?If you are older than age 50 but younger than age 70, get at least 1,200 mg of calcium and at least 600 international units (IU) of vitamin D per day. ?If you are older than age 70, get at least 1,200 mg of calcium and at least 800 international units (IU) of vitamin D per day. ?Smoking and drinking excessive alcohol increase the risk of osteoporosis. Eat foods that are rich in calcium and vitamin D, and do weight-bearing exercises several times each week as directed by your health care provider. ?How does menopause affect my mental health? ?Depression may occur at any age, but it is more common as you become older. Common symptoms of depression include: ?Feeling depressed. ?Changes in sleep patterns. ?Changes in appetite or eating patterns. ?Feeling an overall lack of motivation or enjoyment of activities that you previously enjoyed. ?Frequent crying spells. ?Talk with your health care provider if you think that you are experiencing any of these symptoms. ?General instructions ?See your health care provider for regular wellness exams and vaccines. This may include: ?Scheduling regular health, dental, and eye exams. ?Getting and maintaining your vaccines. These include: ?Influenza vaccine. Get this vaccine each year before the flu season begins. ?Pneumonia vaccine. ?Shingles vaccine. ?Tetanus, diphtheria, and pertussis (Tdap) booster vaccine. ?Your health care provider may also recommend other immunizations. ?Tell your health care provider if you have ever been abused or do not feel safe at home. ?Summary ?Menopause is a normal process in which your ability to get pregnant comes to an end. ?This condition causes hot flashes, night sweats, decreased interest in sex, mood swings, headaches, or lack  of sleep. ?Treatment for this condition may include hormone replacement therapy. ?Take actions to keep yourself healthy, including exercising regularly, eating a healthy diet, watching your weight, and checking your blood pressure and blood sugar levels. ?Get screened for cancer and depression. Make sure that you are up to date with all your vaccines. ?This information is not intended to replace advice given to you by your health care provider. Make sure you discuss any questions you have with your health care provider. ?Document Revised: 08/06/2020 Document Reviewed: 08/06/2020 ?Elsevier Patient Education ? 2022 [employer] ? ?

## 2021-07-09 NOTE — Progress Notes (Signed)
? ?Subjective:  ? ? Patient ID: Teresa Wilkerson, female    DOB: [birth year], 56 y.o.   MRN: [id number] ? ?HPI ? ?Patient presents to clinic today for her annual exam. ? ?Flu: 01/2019 ?Tetanus: 08/2016 ?COVID: Pfizer x2 ?Shingrix: Never ?Pap smear: 05/2018 ?Mammogram: 03/2021, [facility] ?Bone density:  < 5 years ago ?Colon screening: 05/2015 ?Vision screening: annually ?Dentist: biannually ? ?Diet: She does eat meat. She consumes more veggies than fruits. She tries to avoid fried foods. She drinks mostly water. ?Exercise: Youtube exercises vs gym 3 x week ? ?Review of Systems ? ?   ?No past medical history on file. ? ?Current Outpatient Medications  ?Medication Sig Dispense Refill  ? Calcium Carb-Cholecalciferol 600-800 MG-UNIT TABS Take 1 tablet by mouth daily.    ? EPINEPHrine 0.3 mg/0.3 mL IJ SOAJ injection     ? fluticasone (FLONASE) 50 MCG/ACT nasal spray Place 2 sprays into both nostrils daily. 16 g 11  ? meloxicam (MOBIC) 15 MG tablet Take 1 tablet (15 mg total) by mouth daily. 30 tablet 2  ? Multiple Vitamins-Minerals (CENTRUM SILVER 50+WOMEN) TABS Take 1 tablet by mouth daily.    ? ?No current facility-administered medications for this visit.  ? ? ?Allergies  ?Allergen Reactions  ? Amoxicillin   ? Sulfonamide Derivatives   ? ? ?Family History  ?Problem Relation Age of Onset  ? Hyperlipidemia Father   ? Coronary artery disease Unknown   ?     GM with CABG  ? Hyperlipidemia Unknown   ?     Uncles and aunts  ? Colon cancer Paternal Grandfather   ? Cancer Paternal Grandfather   ?     colon and lung  ? Diabetes Neg Hx   ? ? ?Social History  ? ?Socioeconomic History  ? Marital status: Married  ?  Spouse name: Not on file  ? Number of children: 1  ? Years of education: Not on file  ? Highest education level: Not on file  ?Occupational History  ? Not on file  ?Tobacco Use  ? Smoking status: Never  ? Smokeless tobacco: Never  ?Substance and Sexual Activity  ? Alcohol use: No  ?  Alcohol/week: 0.0 standard drinks   ? Drug use: No  ? Sexual activity: Yes  ?  Birth control/protection: None  ?Other Topics Concern  ? Not on file  ?Social History Narrative  ? No caffeine  ? ?Social Determinants of Health  ? ?Financial Resource Strain: Not on file  ?Food Insecurity: Not on file  ?Transportation Needs: Not on file  ?Physical Activity: Not on file  ?Stress: Not on file  ?Social Connections: Not on file  ?Intimate Partner Violence: Not on file  ? ? ? ?Constitutional: Denies fever, malaise, fatigue, headache or abrupt weight changes.  ?HEENT: Denies eye pain, eye redness, ear pain, ringing in the ears, wax buildup, runny nose, nasal congestion, bloody nose, or sore throat. ?Respiratory: Denies difficulty breathing, shortness of breath, cough or sputum production.   ?Cardiovascular: Denies chest pain, chest tightness, palpitations or swelling in the hands or feet.  ?Gastrointestinal: Denies abdominal pain, bloating, constipation, diarrhea or blood in the stool.  ?GU: Denies urgency, frequency, pain with urination, burning sensation, blood in urine, odor or discharge. ?Musculoskeletal: Patient reports intermittent hip pain.  Denies decrease in range of motion, difficulty with gait, muscle pain or joint swelling.  ?Skin: Denies redness, rashes, lesions or ulcercations.  ?Neurological: Denies dizziness, difficulty with memory, difficulty with speech or problems  with balance and coordination.  ?Psych: Denies anxiety, depression, SI/HI. ? ?No other specific complaints in a complete review of systems (except as listed in HPI above). ? ?Objective:  ? Physical Exam ? ?BP 116/74 (BP Location: Left Arm, Patient Position: Sitting, Cuff Size: Normal)   Pulse (!) 54   Temp (!) 97.1 ?F (36.2 ?C) (Temporal)   Ht 5' 3.25" (1.607 m)   Wt 132 lb (59.9 kg)   SpO2 99%   BMI 23.20 kg/m?  ? ?Wt Readings from Last 3 Encounters:  ?03/26/20 139 lb (63 kg)  ?03/15/19 131 lb (59.4 kg)  ?03/10/18 134 lb (60.8 kg)  ? ? ?General: Appears her stated age, well  developed, well nourished in NAD. ?Skin: Warm, dry and intact.  ?HEENT: Head: normal shape and size; Eyes: sclera Maiers, PERRLA and EOMs intact;  ?Neck:  Neck supple, trachea midline. No masses, lumps or thyromegaly present.  ?Cardiovascular: Bradycardic with normal rhythm. S1,S2 noted.  No murmur, rubs or gallops noted. No JVD or BLE edema. No carotid bruits noted. ?Pulmonary/Chest: Normal effort and positive vesicular breath sounds. No respiratory distress. No wheezes, rales or ronchi noted.  ?Abdomen: Soft and nontender. Normal bowel sounds.  ?Musculoskeletal: Strength 5/5 BUE/BLE.  No difficulty with gait.  ?Neurological: Alert and oriented. Cranial nerves II-XII grossly intact. Coordination normal.  ?Psychiatric: Mood and affect normal. Behavior is normal. Judgment and thought content normal.  ? ? ? ?BMET ?   ?Component Value Date/Time  ? NA 139 03/26/2020 1035  ? K 3.7 03/26/2020 1035  ? CL 105 03/26/2020 1035  ? CO2 27 03/26/2020 1035  ? GLUCOSE 100 (H) 03/26/2020 1035  ? BUN 17 03/26/2020 1035  ? CREATININE 0.84 03/26/2020 1035  ? CALCIUM 9.0 03/26/2020 1035  ? ? ?Lipid Panel  ?   ?Component Value Date/Time  ? CHOL 170 03/26/2020 1035  ? TRIG 134.0 03/26/2020 1035  ? HDL 59.30 03/26/2020 1035  ? CHOLHDL 3 03/26/2020 1035  ? VLDL 26.8 03/26/2020 1035  ? [place] 84 03/26/2020 1035  ? ? ?CBC ?   ?Component Value Date/Time  ? WBC 4.7 03/26/2020 1035  ? RBC 4.28 03/26/2020 1035  ? HGB 12.8 03/26/2020 1035  ? HCT 37.3 03/26/2020 1035  ? PLT 187.0 03/26/2020 1035  ? MCV 87.3 03/26/2020 1035  ? MCHC 34.4 03/26/2020 1035  ? RDW 13.4 03/26/2020 1035  ? ? ?Hgb A1C ?No results found for: HGBA1C ? ? ? ? ?   ?Assessment & Plan:  ? ?Preventative Health Maintenance: ? ?Encouraged her to get a flu shot in fall ?Tetanus UTD ?Encouraged her to get her COVID booster ?Discussed Shingrix vaccine, she will check coverage with her insurance company and schedule a nurse visit if she would like to have this done ?Pap smear  UTD ?Mammogram and bone density UTD, will request copy of this from [facility] ?Colon screening UTD ?Encouraged her to consume a balanced diet and exercise regimen ?Advised her to see an eye doctor and dentist annually ?We will check CBC, c-Met and lipid profile today ? ?RTC in 1 year for your annual exam, sooner if needed ?Webb Silversmith, NP ? ? ?

## 2021-07-10 LAB — CBC
HCT: 39.3 % (ref 35.0–45.0)
Hemoglobin: 13.1 g/dL (ref 11.7–15.5)
MCH: 30.1 pg (ref 27.0–33.0)
MCHC: 33.3 g/dL (ref 32.0–36.0)
MCV: 90.3 fL (ref 80.0–100.0)
MPV: 10.8 fL (ref 7.5–12.5)
Platelets: 210 10*3/uL (ref 140–400)
RBC: 4.35 10*6/uL (ref 3.80–5.10)
RDW: 13.1 % (ref 11.0–15.0)
WBC: 5.7 10*3/uL (ref 3.8–10.8)

## 2021-07-10 LAB — LIPID PANEL
Cholesterol: 169 mg/dL (ref <200)
HDL: 66 mg/dL (ref >=50)
LDL Cholesterol (Calc): 78 mg/dL (calc)
Non-HDL Cholesterol (Calc): 103 mg/dL (calc) (ref <130)
Total CHOL/HDL Ratio: 2.6 (calc) (ref <5.0)
Triglycerides: 153 mg/dL — ABNORMAL HIGH (ref <150)

## 2021-07-10 LAB — COMPLETE METABOLIC PANEL WITH GFR
AG Ratio: 1.5 (calc) (ref 1.0–2.5)
ALT: 10 U/L (ref 6–29)
AST: 13 U/L (ref 10–35)
Albumin: 4.1 g/dL (ref 3.6–5.1)
Alkaline phosphatase (APISO): 61 U/L (ref 37–153)
BUN: 19 mg/dL (ref 7–25)
CO2: 23 mmol/L (ref 20–32)
Calcium: 9.4 mg/dL (ref 8.6–10.4)
Chloride: 107 mmol/L (ref 98–110)
Creat: 0.82 mg/dL (ref 0.50–1.03)
Globulin: 2.7 g/dL (calc) (ref 1.9–3.7)
Glucose, Bld: 102 mg/dL (ref 65–139)
Potassium: 3.9 mmol/L (ref 3.5–5.3)
Sodium: 141 mmol/L (ref 135–146)
Total Bilirubin: 0.4 mg/dL (ref 0.2–1.2)
Total Protein: 6.8 g/dL (ref 6.1–8.1)
eGFR: 83 mL/min/{1.73_m2} (ref >=60)

## 2022-05-29 LAB — HM DEXA SCAN: HM Dexa Scan: NORMAL

## 2022-05-29 LAB — HM MAMMOGRAPHY

## 2022-05-29 LAB — HM PAP SMEAR: HM Pap smear: NORMAL

## 2022-05-29 LAB — RESULTS CONSOLE HPV: CHL HPV: NEGATIVE

## 2022-08-21 ENCOUNTER — Ambulatory Visit (INDEPENDENT_AMBULATORY_CARE_PROVIDER_SITE_OTHER): Payer: 59 | Admitting: Internal Medicine

## 2022-08-21 ENCOUNTER — Encounter: Payer: Self-pay | Admitting: Internal Medicine

## 2022-08-21 VITALS — BP 122/72 | HR 64 | Temp 97.1°F | Resp 99 | Ht 63.25 in | Wt 129.0 lb

## 2022-08-21 DIAGNOSIS — Z0000 Encounter for general adult medical examination without abnormal findings: Secondary | ICD-10-CM

## 2022-08-21 DIAGNOSIS — R7309 Other abnormal glucose: Secondary | ICD-10-CM | POA: Diagnosis not present

## 2022-08-21 NOTE — Progress Notes (Signed)
Subjective:    Patient ID: Teresa Wilkerson, female    DOB: 11-16-1965, 57 y.o.   MRN: [id number]  HPI  Patient presents to clinic today for her annual exam.  Flu: 01/2022 Tetanus: 08/2016 COVID: Pfizer x 2 Shingrix: Never Pap smear: 01/2021 Mammogram: 05/2022 Bone density: 2/204 Colon screening: 05/2015 Vision screening: annually Dentist: biannually  Diet: She does eat meat. She consumes fruits and veggies. She tries to avoid fried foods. She drinks mostly water, tomato juice, chocolate milk Exercise: home workouts, walking, lift weights   Review of Systems     No past medical history on file.  Current Outpatient Medications  Medication Sig Dispense Refill   Calcium Carb-Cholecalciferol 600-800 MG-UNIT TABS Take 1 tablet by mouth daily.     EPINEPHrine 0.3 mg/0.3 mL IJ SOAJ injection      fluticasone (FLONASE) 50 MCG/ACT nasal spray Place 2 sprays into both nostrils daily. 16 g 11   meloxicam (MOBIC) 15 MG tablet Take 1 tablet (15 mg total) by mouth daily. 30 tablet 2   Multiple Vitamins-Minerals (CENTRUM SILVER 50+WOMEN) TABS Take 1 tablet by mouth daily.     No current facility-administered medications for this visit.    Allergies  Allergen Reactions   Amoxicillin    Sulfonamide Derivatives     Family History  Problem Relation Age of Onset   Hyperlipidemia Father    Coronary artery disease Paternal Grandmother    Colon cancer Paternal Grandfather    Lung cancer Paternal Grandfather    Hyperlipidemia Other        Uncles and aunts   Diabetes Neg Hx    Breast cancer Neg Hx    Ovarian cancer Neg Hx     Social History   Socioeconomic History   Marital status: Married    Spouse name: Not on file   Number of children: 1   Years of education: Not on file   Highest education level: Not on file  Occupational History   Not on file  Tobacco Use   Smoking status: Never   Smokeless tobacco: Never  Substance and Sexual Activity   Alcohol use: No     Alcohol/week: 0.0 standard drinks of alcohol   Drug use: No   Sexual activity: Yes    Birth control/protection: None  Other Topics Concern   Not on file  Social History Narrative   No caffeine   Social Determinants of Corporate investment banker Strain: Not on file  Food Insecurity: Not on file  Transportation Needs: Not on file  Physical Activity: Not on file  Stress: Not on file  Social Connections: Not on file  Intimate Partner Violence: Not on file     Constitutional: Denies fever, malaise, fatigue, headache or abrupt weight changes.  HEENT: Denies eye pain, eye redness, ear pain, ringing in the ears, wax buildup, runny nose, nasal congestion, bloody nose, or sore throat. Respiratory: Denies difficulty breathing, shortness of breath, cough or sputum production.   Cardiovascular: Denies chest pain, chest tightness, palpitations or swelling in the hands or feet.  Gastrointestinal: Denies abdominal pain, bloating, constipation, diarrhea or blood in the stool.  GU: Denies urgency, frequency, pain with urination, burning sensation, blood in urine, odor or discharge. Musculoskeletal: Denies decrease in range of motion, difficulty with gait, muscle pain or joint pain and swelling.  Skin: Patient reports generalized rash.  Denies redness, lesions or ulcercations.  Neurological: Denies dizziness, difficulty with memory, difficulty with speech or problems with balance and coordination.  Psych: Denies anxiety, depression, SI/HI.  No other specific complaints in a complete review of systems (except as listed in HPI above).  Objective:   Physical Exam   BP 122/72 (BP Location: Right Arm, Patient Position: Sitting, Cuff Size: Normal)   Pulse 64   Temp (!) 97.1 F (36.2 C) (Temporal)   Resp (!) 99   Ht 5' 3.25" (1.607 m)   Wt 129 lb (58.5 kg)   BMI 22.67 kg/m   Wt Readings from Last 3 Encounters:  07/09/21 132 lb (59.9 kg)  03/26/20 139 lb (63 kg)  03/15/19 131 lb (59.4 kg)     General: Appears her stated age, well developed, well nourished in NAD. Skin: Warm, dry and intact.  Maculopapular rash noted over the upper chest. HEENT: Head: normal shape and size; Eyes: sclera Podgorski, no icterus, conjunctiva pink, PERRLA and EOMs intact;  Neck:  Neck supple, trachea midline. No masses, lumps or thyromegaly present.  Cardiovascular: Normal rate and rhythm. S1,S2 noted.  No murmur, rubs or gallops noted. No JVD or BLE edema. No carotid bruits noted. Pulmonary/Chest: Normal effort and positive vesicular breath sounds. No respiratory distress. No wheezes, rales or ronchi noted.  Abdomen: Normal bowel sounds.  Musculoskeletal: Strength 5/5 BUE/BLE.  No difficulty with gait.  Neurological: Alert and oriented. Cranial nerves II-XII grossly intact. Coordination normal.  Psychiatric: Mood and affect normal. Behavior is normal. Judgment and thought content normal.   BMET    Component Value Date/Time   NA 141 07/09/2021 1359   K 3.9 07/09/2021 1359   CL 107 07/09/2021 1359   CO2 23 07/09/2021 1359   GLUCOSE 102 07/09/2021 1359   BUN 19 07/09/2021 1359   CREATININE 0.82 07/09/2021 1359   CALCIUM 9.4 07/09/2021 1359    Lipid Panel     Component Value Date/Time   CHOL 169 07/09/2021 1359   TRIG 153 (H) 07/09/2021 1359   HDL 66 07/09/2021 1359   CHOLHDL 2.6 07/09/2021 1359   VLDL 26.8 03/26/2020 1035   LDLCALC 78 07/09/2021 1359    CBC    Component Value Date/Time   WBC 5.7 07/09/2021 1359   RBC 4.35 07/09/2021 1359   HGB 13.1 07/09/2021 1359   HCT 39.3 07/09/2021 1359   PLT 210 07/09/2021 1359   MCV 90.3 07/09/2021 1359   MCH 30.1 07/09/2021 1359   MCHC 33.3 07/09/2021 1359   RDW 13.1 07/09/2021 1359    Hgb A1C No results found for: "HGBA1C"         Assessment & Plan:   Preventative Health Maintenance:  Encouraged her to get a flu shot in the fall Tetanus UTD Encouraged her to get her COVID booster Discussed Shingrix vaccine, she will check  coverage with her insurance company and schedule visit if she would like to have this done Pap smear UTD Mammogram and bone density UTD, will request copy Colon screening UTD Encouraged her to consume a balanced diet and exercise regimen Advised her to see an eye doctor and dentist annually We will check CBC, c-Met, lipid, A1c today  RTC in 1 year for your annual exam Nicki Reaper, NP

## 2022-08-21 NOTE — Patient Instructions (Signed)
Health Maintenance for Postmenopausal Women Menopause is a normal process in which your ability to get pregnant comes to an end. This process happens slowly over many months or years, usually between the ages of 48 and 55. Menopause is complete when you have missed your menstrual period for 12 months. It is important to talk with your health care provider about some of the most common conditions that affect women after menopause (postmenopausal women). These include heart disease, cancer, and bone loss (osteoporosis). Adopting a healthy lifestyle and getting preventive care can help to promote your health and wellness. The actions you take can also lower your chances of developing some of these common conditions. What are the signs and symptoms of menopause? During menopause, you may have the following symptoms: Hot flashes. These can be moderate or severe. Night sweats. Decrease in sex drive. Mood swings. Headaches. Tiredness (fatigue). Irritability. Memory problems. Problems falling asleep or staying asleep. Talk with your health care provider about treatment options for your symptoms. Do I need hormone replacement therapy? Hormone replacement therapy is effective in treating symptoms that are caused by menopause, such as hot flashes and night sweats. Hormone replacement carries certain risks, especially as you become older. If you are thinking about using estrogen or estrogen with progestin, discuss the benefits and risks with your health care provider. How can I reduce my risk for heart disease and stroke? The risk of heart disease, heart attack, and stroke increases as you age. One of the causes may be a change in the body's hormones during menopause. This can affect how your body uses dietary fats, triglycerides, and cholesterol. Heart attack and stroke are medical emergencies. There are many things that you can do to help prevent heart disease and stroke. Watch your blood pressure High  blood pressure causes heart disease and increases the risk of stroke. This is more likely to develop in people who have high blood pressure readings or are overweight. Have your blood pressure checked: Every 3-5 years if you are 18-39 years of age. Every year if you are 40 years old or older. Eat a healthy diet  Eat a diet that includes plenty of vegetables, fruits, low-fat dairy products, and lean protein. Do not eat a lot of foods that are high in solid fats, added sugars, or sodium. Get regular exercise Get regular exercise. This is one of the most important things you can do for your health. Most adults should: Try to exercise for at least 150 minutes each week. The exercise should increase your heart rate and make you sweat (moderate-intensity exercise). Try to do strengthening exercises at least twice each week. Do these in addition to the moderate-intensity exercise. Spend less time sitting. Even light physical activity can be beneficial. Other tips Work with your health care provider to achieve or maintain a healthy weight. Do not use any products that contain nicotine or tobacco. These products include cigarettes, chewing tobacco, and vaping devices, such as e-cigarettes. If you need help quitting, ask your health care provider. Know your numbers. Ask your health care provider to check your cholesterol and your blood sugar (glucose). Continue to have your blood tested as directed by your health care provider. Do I need screening for cancer? Depending on your health history and family history, you may need to have cancer screenings at different stages of your life. This may include screening for: Breast cancer. Cervical cancer. Lung cancer. Colorectal cancer. What is my risk for osteoporosis? After menopause, you may be   at increased risk for osteoporosis. Osteoporosis is a condition in which bone destruction happens more quickly than new bone creation. To help prevent osteoporosis or  the bone fractures that can happen because of osteoporosis, you may take the following actions: If you are 19-50 years old, get at least 1,000 mg of calcium and at least 600 international units (IU) of vitamin D per day. If you are older than age 50 but younger than age 70, get at least 1,200 mg of calcium and at least 600 international units (IU) of vitamin D per day. If you are older than age 70, get at least 1,200 mg of calcium and at least 800 international units (IU) of vitamin D per day. Smoking and drinking excessive alcohol increase the risk of osteoporosis. Eat foods that are rich in calcium and vitamin D, and do weight-bearing exercises several times each week as directed by your health care provider. How does menopause affect my mental health? Depression may occur at any age, but it is more common as you become older. Common symptoms of depression include: Feeling depressed. Changes in sleep patterns. Changes in appetite or eating patterns. Feeling an overall lack of motivation or enjoyment of activities that you previously enjoyed. Frequent crying spells. Talk with your health care provider if you think that you are experiencing any of these symptoms. General instructions See your health care provider for regular wellness exams and vaccines. This may include: Scheduling regular health, dental, and eye exams. Getting and maintaining your vaccines. These include: Influenza vaccine. Get this vaccine each year before the flu season begins. Pneumonia vaccine. Shingles vaccine. Tetanus, diphtheria, and pertussis (Tdap) booster vaccine. Your health care provider may also recommend other immunizations. Tell your health care provider if you have ever been abused or do not feel safe at home. Summary Menopause is a normal process in which your ability to get pregnant comes to an end. This condition causes hot flashes, night sweats, decreased interest in sex, mood swings, headaches, or lack  of sleep. Treatment for this condition may include hormone replacement therapy. Take actions to keep yourself healthy, including exercising regularly, eating a healthy diet, watching your weight, and checking your blood pressure and blood sugar levels. Get screened for cancer and depression. Make sure that you are up to date with all your vaccines. This information is not intended to replace advice given to you by your health care provider. Make sure you discuss any questions you have with your health care provider. Document Revised: 08/06/2020 Document Reviewed: 08/06/2020 Elsevier Patient Education  2023 [employer] Inc[employer]  

## 2022-08-22 LAB — LIPID PANEL
Cholesterol: 170 mg/dL (ref <200)
HDL: 69 mg/dL (ref >=50)
LDL Cholesterol (Calc): 82 mg/dL (calc)
Non-HDL Cholesterol (Calc): 101 mg/dL (calc) (ref <130)
Total CHOL/HDL Ratio: 2.5 (calc) (ref <5.0)
Triglycerides: 98 mg/dL (ref <150)

## 2022-08-22 LAB — COMPLETE METABOLIC PANEL WITH GFR
AG Ratio: 1.5 (calc) (ref 1.0–2.5)
ALT: 10 U/L (ref 6–29)
AST: 12 U/L (ref 10–35)
Albumin: 4 g/dL (ref 3.6–5.1)
Alkaline phosphatase (APISO): 60 U/L (ref 37–153)
BUN: 18 mg/dL (ref 7–25)
CO2: 25 mmol/L (ref 20–32)
Calcium: 8.3 mg/dL — ABNORMAL LOW (ref 8.6–10.4)
Chloride: 107 mmol/L (ref 98–110)
Creat: 0.79 mg/dL (ref 0.50–1.03)
Globulin: 2.6 g/dL (calc) (ref 1.9–3.7)
Glucose, Bld: 98 mg/dL (ref 65–99)
Potassium: 4.5 mmol/L (ref 3.5–5.3)
Sodium: 139 mmol/L (ref 135–146)
Total Bilirubin: 0.5 mg/dL (ref 0.2–1.2)
Total Protein: 6.6 g/dL (ref 6.1–8.1)
eGFR: 87 mL/min/{1.73_m2} (ref >=60)

## 2022-08-22 LAB — CBC
HCT: 38.4 % (ref 35.0–45.0)
Hemoglobin: 12.7 g/dL (ref 11.7–15.5)
MCH: 29.5 pg (ref 27.0–33.0)
MCHC: 33.1 g/dL (ref 32.0–36.0)
MCV: 89.1 fL (ref 80.0–100.0)
MPV: 10.2 fL (ref 7.5–12.5)
Platelets: 211 10*3/uL (ref 140–400)
RBC: 4.31 10*6/uL (ref 3.80–5.10)
RDW: 12.9 % (ref 11.0–15.0)
WBC: 4.8 10*3/uL (ref 3.8–10.8)

## 2022-08-22 LAB — HEMOGLOBIN A1C
Hgb A1c MFr Bld: 5.8 % of total Hgb — ABNORMAL HIGH (ref <5.7)
Mean Plasma Glucose: 120 mg/dL
eAG (mmol/L): 6.6 mmol/L

## 2023-07-02 ENCOUNTER — Encounter: Payer: Self-pay | Admitting: Internal Medicine

## 2023-07-02 MED ORDER — MELOXICAM 15 MG PO TABS: 15.0000 mg | ORAL_TABLET | Freq: Every day | ORAL | 1 refills | Status: AC

## 2023-08-21 ENCOUNTER — Encounter: Payer: Self-pay | Admitting: Internal Medicine

## 2023-09-10 ENCOUNTER — Ambulatory Visit (INDEPENDENT_AMBULATORY_CARE_PROVIDER_SITE_OTHER): Payer: Self-pay | Admitting: Internal Medicine

## 2023-09-10 VITALS — BP 118/78 | Ht 63.25 in | Wt 129.8 lb

## 2023-09-10 DIAGNOSIS — R7303 Prediabetes: Secondary | ICD-10-CM | POA: Diagnosis not present

## 2023-09-10 DIAGNOSIS — M542 Cervicalgia: Secondary | ICD-10-CM | POA: Diagnosis not present

## 2023-09-10 DIAGNOSIS — Z0000 Encounter for general adult medical examination without abnormal findings: Secondary | ICD-10-CM

## 2023-09-10 DIAGNOSIS — Z136 Encounter for screening for cardiovascular disorders: Secondary | ICD-10-CM

## 2023-09-10 DIAGNOSIS — M722 Plantar fascial fibromatosis: Secondary | ICD-10-CM | POA: Insufficient documentation

## 2023-09-10 MED ORDER — METHOCARBAMOL 500 MG PO TABS
500.0000 mg | ORAL_TABLET | Freq: Every evening | ORAL | 0 refills | Status: AC | PRN
Start: 2023-09-10 — End: ?

## 2023-09-10 NOTE — Progress Notes (Signed)
 Subjective:    Patient ID: Teresa Wilkerson, female    DOB: [birth year], 58 y.o.   MRN: [id number]  HPI  Patient presents to clinic today for her annual exam.  Flu: 01/2023 Tetanus: 08/2016 COVID: Pfizer x 2 Shingrix: Never Pap smear: 01/2021 Mammogram: 05/2023, [facility], [place] Bone density: 05/2022 Colon screening: 05/2015 Vision screening: annually Dentist: biannually  Diet: She does eat meat. She consumes fruits and veggies. She tries to avoid fried foods. She drinks mostly water, tomato juice, chocolate milk Exercise: home workouts, walking, lift weights   Review of Systems     No past medical history on file.  Current Outpatient Medications  Medication Sig Dispense Refill   Calcium Carb-Cholecalciferol 600-800 MG-UNIT TABS Take 1 tablet by mouth daily.     EPINEPHrine 0.3 mg/0.3 mL IJ SOAJ injection      fluticasone  (FLONASE ) 50 MCG/ACT nasal spray Place 2 sprays into both nostrils daily. 16 g 11   meloxicam  (MOBIC ) 15 MG tablet Take 1 tablet (15 mg total) by mouth daily. 30 tablet 1   Multiple Vitamin (MULTIVITAMIN) capsule Take 1 capsule by mouth daily.     Multiple Vitamins-Minerals (CENTRUM SILVER 50+WOMEN) TABS Take 1 tablet by mouth daily.     No current facility-administered medications for this visit.    Allergies  Allergen Reactions   Amoxicillin    Sulfonamide Derivatives     Family History  Problem Relation Age of Onset   Hyperlipidemia Father    Coronary artery disease Paternal Grandmother    Colon cancer Paternal Grandfather    Lung cancer Paternal Grandfather    Hyperlipidemia Other        Uncles and aunts   Diabetes Neg Hx    Breast cancer Neg Hx    Ovarian cancer Neg Hx     Social History   Socioeconomic History   Marital status: Married    Spouse name: Not on file   Number of children: 1   Years of education: Not on file   Highest education level: Not on file  Occupational History   Not on file  Tobacco Use   Smoking  status: Never   Smokeless tobacco: Never  Substance and Sexual Activity   Alcohol use: No    Alcohol/week: 0.0 standard drinks of alcohol   Drug use: No   Sexual activity: Yes    Birth control/protection: None  Other Topics Concern   Not on file  Social History Narrative   ** Merged History Encounter **       No caffeine   Social Drivers of Corporate investment banker Strain: Not on file  Food Insecurity: Not on file  Transportation Needs: Not on file  Physical Activity: Not on file  Stress: Not on file  Social Connections: Not on file  Intimate Partner Violence: Not on file     Constitutional: Denies fever, malaise, fatigue, headache or abrupt weight changes.  HEENT: Denies eye pain, eye redness, ear pain, ringing in the ears, wax buildup, runny nose, nasal congestion, bloody nose, or sore throat. Respiratory: Denies difficulty breathing, shortness of breath, cough or sputum production.   Cardiovascular: Denies chest pain, chest tightness, palpitations or swelling in the hands or feet.  Gastrointestinal: Denies abdominal pain, bloating, constipation, diarrhea or blood in the stool.  GU: Denies urgency, frequency, pain with urination, burning sensation, blood in urine, odor or discharge. Musculoskeletal: Pt reports right foot pain (plantar fasciitis), right side neck pain. Denies decrease in range  of motion, difficulty with gait, or joint swelling.  Skin: Denies redness, rashes, lesions or ulcercations.  Neurological: Denies dizziness, difficulty with memory, difficulty with speech or problems with balance and coordination.  Psych: Denies anxiety, depression, SI/HI.  No other specific complaints in a complete review of systems (except as listed in HPI above).  Objective:   Physical Exam   BP 118/78 (BP Location: Left Arm, Patient Position: Sitting, Cuff Size: Normal)   Ht 5' 3.25 (1.607 m)   Wt 129 lb 12.8 oz (58.9 kg)   LMP 08/24/2016 Comment: irregular  BMI 22.81  kg/m    Wt Readings from Last 3 Encounters:  08/21/22 129 lb (58.5 kg)  07/09/21 132 lb (59.9 kg)  03/26/20 139 lb (63 kg)    General: Appears her stated age, well developed, well nourished in NAD. Skin: Warm, dry and intact.   HEENT: Head: normal shape and size; Eyes: sclera Tarpley, no icterus, conjunctiva pink, PERRLA and EOMs intact;  Neck:  Neck supple, trachea midline. No masses, lumps or thyromegaly present.  Cardiovascular: Normal rate and rhythm. S1,S2 noted.  No murmur, rubs or gallops noted. No JVD or BLE edema. No carotid bruits noted. Pulmonary/Chest: Normal effort and positive vesicular breath sounds. No respiratory distress. No wheezes, rales or ronchi noted.  Abdomen: Normal bowel sounds.  Musculoskeletal: Strength 5/5 BUE/BLE.  No difficulty with gait.  Neurological: Alert and oriented. Cranial nerves II-XII grossly intact. Coordination normal.  Psychiatric: Mood and affect normal. Behavior is normal. Judgment and thought content normal.   BMET    Component Value Date/Time   NA 139 08/21/2022 0925   K 4.5 08/21/2022 0925   CL 107 08/21/2022 0925   CO2 25 08/21/2022 0925   GLUCOSE 98 08/21/2022 0925   BUN 18 08/21/2022 0925   CREATININE 0.79 08/21/2022 0925   CALCIUM 8.3 (L) 08/21/2022 0925    Lipid Panel     Component Value Date/Time   CHOL 170 08/21/2022 0925   TRIG 98 08/21/2022 0925   HDL 69 08/21/2022 0925   CHOLHDL 2.5 08/21/2022 0925   VLDL 26.8 03/26/2020 1035   LDLCALC 82 08/21/2022 0925    CBC    Component Value Date/Time   WBC 4.8 08/21/2022 0925   RBC 4.31 08/21/2022 0925   HGB 12.7 08/21/2022 0925   HCT 38.4 08/21/2022 0925   PLT 211 08/21/2022 0925   MCV 89.1 08/21/2022 0925   MCH 29.5 08/21/2022 0925   MCHC 33.1 08/21/2022 0925   RDW 12.9 08/21/2022 0925    Hgb A1C Lab Results  Component Value Date   HGBA1C 5.8 (H) 08/21/2022           Assessment & Plan:   Preventative Health Maintenance:  Encouraged her to get a  flu shot in the fall Tetanus UTD Encouraged her to get her COVID booster Discussed Shingrix vaccine, she will check coverage with her insurance company and schedule visit if she would like to have this done Pap smear UTD Mammogram UTD, will request copy Bone density UTD Colon screening UTD Encouraged her to consume a balanced diet and exercise regimen Advised her to see an eye doctor and dentist annually We will check CBC, c-Met, lipid, A1c today  Right side neck pain:  Encourage stretching exercises, heat and massage Rx for methocarbamol 500 mg nightly-sedation  RTC in 1 year for your annual exam Helayne Lo, NP

## 2023-09-10 NOTE — Patient Instructions (Signed)
 Health Maintenance for Postmenopausal Women Menopause is a normal process in which your ability to get pregnant comes to an end. This process happens slowly over many months or years, usually between the ages of 24 and 62. Menopause is complete when you have missed your menstrual period for 12 months. It is important to talk with your health care provider about some of the most common conditions that affect women after menopause (postmenopausal women). These include heart disease, cancer, and bone loss (osteoporosis). Adopting a healthy lifestyle and getting preventive care can help to promote your health and wellness. The actions you take can also lower your chances of developing some of these common conditions. What are the signs and symptoms of menopause? During menopause, you may have the following symptoms: Hot flashes. These can be moderate or severe. Night sweats. Decrease in sex drive. Mood swings. Headaches. Tiredness (fatigue). Irritability. Memory problems. Problems falling asleep or staying asleep. Talk with your health care provider about treatment options for your symptoms. Do I need hormone replacement therapy? Hormone replacement therapy is effective in treating symptoms that are caused by menopause, such as hot flashes and night sweats. Hormone replacement carries certain risks, especially as you become older. If you are thinking about using estrogen or estrogen with progestin, discuss the benefits and risks with your health care provider. How can I reduce my risk for heart disease and stroke? The risk of heart disease, heart attack, and stroke increases as you age. One of the causes may be a change in the body's hormones during menopause. This can affect how your body uses dietary fats, triglycerides, and cholesterol. Heart attack and stroke are medical emergencies. There are many things that you can do to help prevent heart disease and stroke. Watch your blood pressure High  blood pressure causes heart disease and increases the risk of stroke. This is more likely to develop in people who have high blood pressure readings or are overweight. Have your blood pressure checked: Every 3-5 years if you are 50-75 years of age. Every year if you are 77 years old or older. Eat a healthy diet  Eat a diet that includes plenty of vegetables, fruits, low-fat dairy products, and lean protein. Do not eat a lot of foods that are high in solid fats, added sugars, or sodium. Get regular exercise Get regular exercise. This is one of the most important things you can do for your health. Most adults should: Try to exercise for at least 150 minutes each week. The exercise should increase your heart rate and make you sweat (moderate-intensity exercise). Try to do strengthening exercises at least twice each week. Do these in addition to the moderate-intensity exercise. Spend less time sitting. Even light physical activity can be beneficial. Other tips Work with your health care provider to achieve or maintain a healthy weight. Do not use any products that contain nicotine or tobacco. These products include cigarettes, chewing tobacco, and vaping devices, such as e-cigarettes. If you need help quitting, ask your health care provider. Know your numbers. Ask your health care provider to check your cholesterol and your blood sugar (glucose). Continue to have your blood tested as directed by your health care provider. Do I need screening for cancer? Depending on your health history and family history, you may need to have cancer screenings at different stages of your life. This may include screening for: Breast cancer. Cervical cancer. Lung cancer. Colorectal cancer. What is my risk for osteoporosis? After menopause, you may be  at increased risk for osteoporosis. Osteoporosis is a condition in which bone destruction happens more quickly than new bone creation. To help prevent osteoporosis or  the bone fractures that can happen because of osteoporosis, you may take the following actions: If you are 61-3 years old, get at least 1,000 mg of calcium and at least 600 international units (IU) of vitamin D per day. If you are older than age 61 but younger than age 75, get at least 1,200 mg of calcium and at least 600 international units (IU) of vitamin D per day. If you are older than age 62, get at least 1,200 mg of calcium and at least 800 international units (IU) of vitamin D per day. Smoking and drinking excessive alcohol increase the risk of osteoporosis. Eat foods that are rich in calcium and vitamin D, and do weight-bearing exercises several times each week as directed by your health care provider. How does menopause affect my mental health? Depression may occur at any age, but it is more common as you become older. Common symptoms of depression include: Feeling depressed. Changes in sleep patterns. Changes in appetite or eating patterns. Feeling an overall lack of motivation or enjoyment of activities that you previously enjoyed. Frequent crying spells. Talk with your health care provider if you think that you are experiencing any of these symptoms. General instructions See your health care provider for regular wellness exams and vaccines. This may include: Scheduling regular health, dental, and eye exams. Getting and maintaining your vaccines. These include: Influenza vaccine. Get this vaccine each year before the flu season begins. Pneumonia vaccine. Shingles vaccine. Tetanus, diphtheria, and pertussis (Tdap) booster vaccine. Your health care provider may also recommend other immunizations. Tell your health care provider if you have ever been abused or do not feel safe at home. Summary Menopause is a normal process in which your ability to get pregnant comes to an end. This condition causes hot flashes, night sweats, decreased interest in sex, mood swings, headaches, or lack  of sleep. Treatment for this condition may include hormone replacement therapy. Take actions to keep yourself healthy, including exercising regularly, eating a healthy diet, watching your weight, and checking your blood pressure and blood sugar levels. Get screened for cancer and depression. Make sure that you are up to date with all your vaccines. This information is not intended to replace advice given to you by your health care provider. Make sure you discuss any questions you have with your health care provider. Document Revised: 08/06/2020 Document Reviewed: 08/06/2020 Elsevier Patient Education  2024 [employer].

## 2023-09-11 ENCOUNTER — Ambulatory Visit: Payer: Self-pay | Admitting: Internal Medicine

## 2023-09-11 LAB — COMPREHENSIVE METABOLIC PANEL WITH GFR
AG Ratio: 1.8 (calc) (ref 1.0–2.5)
ALT: 12 U/L (ref 6–29)
AST: 14 U/L (ref 10–35)
Albumin: 4.1 g/dL (ref 3.6–5.1)
Alkaline phosphatase (APISO): 57 U/L (ref 37–153)
BUN: 18 mg/dL (ref 7–25)
CO2: 26 mmol/L (ref 20–32)
Calcium: 9 mg/dL (ref 8.6–10.4)
Chloride: 106 mmol/L (ref 98–110)
Creat: 0.84 mg/dL (ref 0.50–1.03)
Globulin: 2.3 g/dL (ref 1.9–3.7)
Glucose, Bld: 97 mg/dL (ref 65–99)
Potassium: 4.3 mmol/L (ref 3.5–5.3)
Sodium: 140 mmol/L (ref 135–146)
Total Bilirubin: 0.6 mg/dL (ref 0.2–1.2)
Total Protein: 6.4 g/dL (ref 6.1–8.1)
eGFR: 80 mL/min/{1.73_m2} (ref >=60)

## 2023-09-11 LAB — CBC
HCT: 40.4 % (ref 35.0–45.0)
Hemoglobin: 13 g/dL (ref 11.7–15.5)
MCH: 29.8 pg (ref 27.0–33.0)
MCHC: 32.2 g/dL (ref 32.0–36.0)
MCV: 92.7 fL (ref 80.0–100.0)
MPV: 9.9 fL (ref 7.5–12.5)
Platelets: 200 10*3/uL (ref 140–400)
RBC: 4.36 10*6/uL (ref 3.80–5.10)
RDW: 13.1 % (ref 11.0–15.0)
WBC: 4.9 10*3/uL (ref 3.8–10.8)

## 2023-09-11 LAB — LIPID PANEL
Cholesterol: 169 mg/dL (ref <200)
HDL: 70 mg/dL (ref >=50)
LDL Cholesterol (Calc): 81 mg/dL
Non-HDL Cholesterol (Calc): 99 mg/dL (ref <130)
Total CHOL/HDL Ratio: 2.4 (calc) (ref <5.0)
Triglycerides: 101 mg/dL (ref <150)

## 2023-09-11 LAB — HEMOGLOBIN A1C
Hgb A1c MFr Bld: 5.7 % — ABNORMAL HIGH (ref <5.7)
Mean Plasma Glucose: 117 mg/dL
eAG (mmol/L): 6.5 mmol/L

## 2023-10-27 ENCOUNTER — Ambulatory Visit: Payer: Self-pay

## 2023-10-27 NOTE — Telephone Encounter (Signed)
 FYI Only or Action Required?: FYI only for provider.  Patient was last seen in primary care on 09/10/2023 by Antonette Angeline ORN, NP.  Called Nurse Triage reporting Ankle Pain.  Symptoms began several days ago.  Interventions attempted: Rest, hydration, or home remedies.  Symptoms are: unchanged.  Triage Disposition: See PCP When Office is Open (Within 3 Days)  Patient/caregiver understands and will follow disposition?: Yes  Copied from CRM #[id number]. Topic: Clinical - Red Word Triage >> Oct 27, 2023  9:36 AM Charlet HERO wrote: Red Word that prompted transfer to Nurse Triage: Patient is calling about swelling and pain in her ankle no bite marks she says it had been swollen since her flight to [place]  about a week it was sore. The swollen started on Sunday. Reason for Disposition  MODERATE ankle swelling (e.g., interferes with normal activities, can't move joint normally) (Exceptions: Itchy, localized swelling; swelling is chronic.)  Answer Assessment - Initial Assessment Questions 1. LOCATION: Which ankle is swollen? Where is the swelling?     Left ankle-swelling started on the inside of the ankle which has grown to the outside of ankle 2. ONSET: When did the swelling start?     Sunday 3. SWELLING: How bad is the swelling? Or, How large is it? (e.g., mild, moderate, severe; size of localized swelling)      moderate 4. PAIN: Is there any pain? If Yes, ask: How bad is it? (Scale 0-10; or none, mild, moderate, severe)     Mild-discomfort when pressing on the ankle 5. CAUSE: What do you think caused the ankle swelling?     unsure 6. OTHER SYMPTOMS: Do you have any other symptoms? (e.g., fever, chest pain, difficulty breathing, calf pain)     no  Patient reports recent travel to [place]  on Sunday.  Protocols used: Ankle Swelling-A-AH

## 2023-10-27 NOTE — Telephone Encounter (Signed)
 Will discuss at upcoming appointment

## 2023-10-29 ENCOUNTER — Ambulatory Visit: Admitting: Internal Medicine

## 2023-10-30 ENCOUNTER — Encounter: Payer: Self-pay | Admitting: Podiatry

## 2023-10-30 ENCOUNTER — Ambulatory Visit (INDEPENDENT_AMBULATORY_CARE_PROVIDER_SITE_OTHER)

## 2023-10-30 ENCOUNTER — Ambulatory Visit: Payer: Self-pay | Admitting: Podiatry

## 2023-10-30 VITALS — Ht 63.25 in | Wt 129.8 lb

## 2023-10-30 DIAGNOSIS — M7752 Other enthesopathy of left foot: Secondary | ICD-10-CM

## 2023-10-30 NOTE — Progress Notes (Signed)
   Chief Complaint  Patient presents with   Ankle Pain    Pt is here due to left ankle pain and swelling she states started a week ago, no injury to ankle or foot, since last week swelling has went down some but is still painful. She is not sure why she is having pain but wants to get it checked out.    HPI: 58 y.o. female presenting today as a new patient for evaluation of idiopathic onset of swelling with slight tenderness to the left ankle.  This started about 1 week ago.  There has been some improvement but she continues to notice some swelling and tenderness especially by the end of the day.  Past Medical History:  Diagnosis Date   Allergy     Past Surgical History:  Procedure Laterality Date   CESAREAN SECTION     ENDOVENOUS ABLATION SAPHENOUS VEIN W/ LASER Left    leg   EYE SURGERY  September 2006   Lasik   WISDOM TOOTH EXTRACTION      Allergies  Allergen Reactions   Amoxicillin    Sulfonamide Derivatives      Physical Exam: General: The patient is alert and oriented x3 in no acute distress.  Dermatology: Skin is warm, dry and supple bilateral lower extremities.   Vascular: Palpable pedal pulses bilaterally. Capillary refill within normal limits.  No erythema.  There is some mild to moderate edema noted especially around the medial aspect of the left ankle along the posterior tibial tendon  Neurological: Grossly intact via light touch  Musculoskeletal Exam: Mild tenderness throughout palpation along the posterior tibial tendon of the left medial ankle  Radiographic Exam LT ankle 10/30/2023:  Normal osseous mineralization. Joint spaces preserved.  No fractures or osseous irregularities noted.  Impression: Negative  Assessment/Plan of Care: 1.  Posterior tibial tendinitis with mild localized edema left  -Patient evaluated.  X-rays reviewed -Declined oral anti-inflammatory medication -Recommend good supportive shoes and sneakers that provide arch support and support  the medial longitudinal arch of the foot -Compression ankle sleeve dispensed -RICE -I do feel that this should resolve over time uneventfully. -Return to clinic PRN       Thresa EMERSON Sar, DPM [facility]  Dr. Thresa EMERSON Sar, DPM    [address] [place], [place] [address]                Office [phone]  Fax [phone]

## 2024-04-26 ENCOUNTER — Other Ambulatory Visit (HOSPITAL_COMMUNITY): Payer: Self-pay

## 2024-09-13 ENCOUNTER — Encounter: Admitting: Internal Medicine
